# Patient Record
Sex: Female | Born: 1960 | Race: Black or African American | Hispanic: No | Marital: Single | Smoking: Never smoker
Health system: Southern US, Community
[De-identification: ages and names within clinical notes are randomized; demographics above are authoritative.]

## PROBLEM LIST (undated history)

## (undated) DIAGNOSIS — I10 Essential (primary) hypertension: Secondary | ICD-10-CM

## (undated) DIAGNOSIS — I639 Cerebral infarction, unspecified: Secondary | ICD-10-CM

## (undated) DIAGNOSIS — G43909 Migraine, unspecified, not intractable, without status migrainosus: Secondary | ICD-10-CM

## (undated) DIAGNOSIS — E669 Obesity, unspecified: Secondary | ICD-10-CM

## (undated) DIAGNOSIS — E785 Hyperlipidemia, unspecified: Secondary | ICD-10-CM

## (undated) HISTORY — PX: PILONIDAL CYST EXCISION: SHX744

## (undated) HISTORY — PX: CYST EXCISION: SHX5701

---

## 2011-02-05 ENCOUNTER — Inpatient Hospital Stay (HOSPITAL_COMMUNITY)
Admission: AD | Admit: 2011-02-05 | Discharge: 2011-02-05 | Disposition: A | Discharge status: Home / Self Care | Payer: Self-pay | Source: Ambulatory Visit | Attending: Obstetrics and Gynecology | Admitting: Obstetrics and Gynecology

## 2011-02-05 ENCOUNTER — Inpatient Hospital Stay (HOSPITAL_COMMUNITY): Payer: Self-pay

## 2011-02-05 DIAGNOSIS — N949 Unspecified condition associated with female genital organs and menstrual cycle: Secondary | ICD-10-CM | POA: Insufficient documentation

## 2011-02-05 DIAGNOSIS — N938 Other specified abnormal uterine and vaginal bleeding: Secondary | ICD-10-CM | POA: Insufficient documentation

## 2011-02-05 DIAGNOSIS — D259 Leiomyoma of uterus, unspecified: Secondary | ICD-10-CM | POA: Insufficient documentation

## 2011-02-05 DIAGNOSIS — I10 Essential (primary) hypertension: Secondary | ICD-10-CM | POA: Insufficient documentation

## 2011-02-05 LAB — WET PREP, GENITAL: Trich, Wet Prep: NONE SEEN

## 2011-02-05 LAB — CBC
MCH: 24.2 pg — ABNORMAL LOW (ref 26.0–34.0)
MCHC: 32 g/dL (ref 30.0–36.0)
Platelets: 287 10*3/uL (ref 150–400)
RBC: 4.54 MIL/uL (ref 3.87–5.11)

## 2011-02-05 LAB — SAMPLE TO BLOOD BANK

## 2011-02-06 LAB — GC/CHLAMYDIA PROBE AMP, GENITAL: Chlamydia, DNA Probe: NEGATIVE

## 2011-02-16 ENCOUNTER — Other Ambulatory Visit: Payer: Self-pay | Admitting: Obstetrics & Gynecology

## 2011-02-16 ENCOUNTER — Encounter: Payer: PRIVATE HEALTH INSURANCE | Admitting: Obstetrics & Gynecology

## 2011-02-16 DIAGNOSIS — Z1231 Encounter for screening mammogram for malignant neoplasm of breast: Secondary | ICD-10-CM

## 2011-02-16 DIAGNOSIS — D259 Leiomyoma of uterus, unspecified: Secondary | ICD-10-CM

## 2011-02-16 LAB — POCT PREGNANCY, URINE: Preg Test, Ur: NEGATIVE

## 2011-02-20 ENCOUNTER — Ambulatory Visit (HOSPITAL_COMMUNITY): Payer: PRIVATE HEALTH INSURANCE | Attending: Obstetrics & Gynecology

## 2011-02-23 ENCOUNTER — Ambulatory Visit: Payer: PRIVATE HEALTH INSURANCE

## 2011-03-03 NOTE — Progress Notes (Signed)
NAME:  Brenda Ford, Brenda Ford               ACCOUNT NO.:  [id number]  MEDICAL RECORD NO.:  [id number]           PATIENT TYPE:  A  LOCATION:  [facility]                   FACILITY:  [facility]  PHYSICIAN:  Scheryl Darter, MD       DATE OF BIRTH:  [birth year]  DATE OF SERVICE:                                 CLINIC NOTE  The patient comes today due to history of heavy bleeding, fibroids, and pain.  The patient is a 50 year old black female, gravida 5, para 2-0-3- 2, last menstrual period February 04, 2011.  The patient is having increasing problems with pain and heavy bleeding with her menses and her periods are somewhat irregular.  She came to the MAU at [facility] on February 26 due to heavy bleeding and pain.  Prior to that, her period was in July and then she had bleeding on and off after about 3 months.  Previous care was in [place] and she moved to [place] in August 2011.  She was aware that she had uterine fibroids, but her symptoms have been getting worse.  PAST MEDICAL HISTORY: 1. Hypertension. 2. Hyperlipidemia. 3. History of cerebellar stroke. 4. Anxiety and depression.  PAST SURGICAL HISTORY: 1. Lump removal from her breasts. 2. Laparoscopy. 3. Pilonidal cyst removal.  SOCIAL HISTORY:  The patient is a nonsmoker.  She denies alcohol or drug use.  ALLERGIES:  No known drug allergies.  MEDICATIONS: 1. Hydrocodone 5/325 one to two p.o. p.r.n. pain. 2. Provera one p.o. daily. 3. Ibuprofen 600 mg t.i.d.  REVIEW OF SYSTEMS:  The patient still has some pressure and fullness in her lower abdomen.  She also complains of bilateral breast pain.  She says she has gained about 30 pounds over the last year.  PHYSICAL EXAMINATION:  GENERAL:  The patient is in no acute distress. Her affect is normal. VITAL SIGNS:  Weight 200.8 pounds, height 61 inches, blood pressure 169/95, pulse 65, temperature 98.7. CHEST:  Clear. HEART:  Regular rhythm. BREASTS:  Large and mildly tender  bilaterally but no discrete masses or skin changes or axillary lymphadenopathy. ABDOMEN:  Mildly obese, soft, and slightly tender in lower quadrants with firm mass palpable above the symphysis pubis consistent with enlarged uterus. EXTREMITIES:  No swelling or deformity. PELVIC:  External genitalia, vagina, and cervix appeared normal.  Pap was done.  Uterus is about 14 to 15 weeks' size and mildly tender.  No adnexal masses.  The patient had ultrasound on February 26 that showed uterine fibroids, largest measuring 8.9 cm with a partial submucosal component.  No adnexal masses were identified.  IMPRESSION: 1. Symptomatic fibroid uterus with menorrhagia and pain. 2. History of hypertension, hypercholesterolemia, which she is     currently untreated. 3. History of stroke.  PLAN:  Make referral to Internal Medicine for management of her medical problems.  She is a candidate for total abdominal hysterectomy due to her symptomatic fibroids.  I will order Lupron Depo 11.25 mg IM, which she can receive in the next week or two.  We will also have a screening mammogram.  I deferred doing an endometrial biopsy today because of the patient's  discomfort.     Scheryl Darter, MD    JA/MEDQ  D:  02/16/2011  T:  02/17/2011  Job:  [id number]

## 2011-03-09 ENCOUNTER — Emergency Department (HOSPITAL_COMMUNITY): Payer: Self-pay

## 2011-03-09 ENCOUNTER — Emergency Department (HOSPITAL_COMMUNITY)
Admission: EM | Admit: 2011-03-09 | Discharge: 2011-03-09 | Disposition: A | Discharge status: Home / Self Care | Payer: Self-pay | Attending: Emergency Medicine | Admitting: Emergency Medicine

## 2011-03-09 DIAGNOSIS — I10 Essential (primary) hypertension: Secondary | ICD-10-CM | POA: Insufficient documentation

## 2011-03-09 DIAGNOSIS — F341 Dysthymic disorder: Secondary | ICD-10-CM | POA: Insufficient documentation

## 2011-03-09 DIAGNOSIS — Z8673 Personal history of transient ischemic attack (TIA), and cerebral infarction without residual deficits: Secondary | ICD-10-CM | POA: Insufficient documentation

## 2011-03-09 DIAGNOSIS — E785 Hyperlipidemia, unspecified: Secondary | ICD-10-CM | POA: Insufficient documentation

## 2011-03-09 DIAGNOSIS — R51 Headache: Secondary | ICD-10-CM | POA: Insufficient documentation

## 2011-03-14 ENCOUNTER — Ambulatory Visit: Payer: PRIVATE HEALTH INSURANCE | Admitting: Family Medicine

## 2011-03-20 ENCOUNTER — Ambulatory Visit: Payer: PRIVATE HEALTH INSURANCE | Admitting: Obstetrics & Gynecology

## 2011-04-20 ENCOUNTER — Ambulatory Visit: Payer: PRIVATE HEALTH INSURANCE | Admitting: Obstetrics & Gynecology

## 2011-09-28 ENCOUNTER — Inpatient Hospital Stay (INDEPENDENT_AMBULATORY_CARE_PROVIDER_SITE_OTHER)
Admission: RE | Admit: 2011-09-28 | Discharge: 2011-09-28 | Disposition: A | Discharge status: Home / Self Care | Payer: PRIVATE HEALTH INSURANCE | Source: Ambulatory Visit | Attending: Family Medicine | Admitting: Family Medicine

## 2011-09-28 DIAGNOSIS — I10 Essential (primary) hypertension: Secondary | ICD-10-CM

## 2011-09-28 DIAGNOSIS — R599 Enlarged lymph nodes, unspecified: Secondary | ICD-10-CM

## 2011-09-28 DIAGNOSIS — K089 Disorder of teeth and supporting structures, unspecified: Secondary | ICD-10-CM

## 2011-09-28 DIAGNOSIS — M542 Cervicalgia: Secondary | ICD-10-CM

## 2012-02-11 ENCOUNTER — Encounter (HOSPITAL_COMMUNITY): Payer: Self-pay | Admitting: *Deleted

## 2012-02-11 ENCOUNTER — Emergency Department (HOSPITAL_COMMUNITY): Payer: Medicare Other

## 2012-02-11 ENCOUNTER — Other Ambulatory Visit: Payer: Self-pay

## 2012-02-11 ENCOUNTER — Emergency Department (HOSPITAL_COMMUNITY)
Admission: EM | Admit: 2012-02-11 | Discharge: 2012-02-11 | Disposition: A | Discharge status: Home Health | Payer: Medicare Other | Attending: Emergency Medicine | Admitting: Emergency Medicine

## 2012-02-11 DIAGNOSIS — M2550 Pain in unspecified joint: Secondary | ICD-10-CM | POA: Insufficient documentation

## 2012-02-11 DIAGNOSIS — Z8673 Personal history of transient ischemic attack (TIA), and cerebral infarction without residual deficits: Secondary | ICD-10-CM | POA: Insufficient documentation

## 2012-02-11 DIAGNOSIS — I10 Essential (primary) hypertension: Secondary | ICD-10-CM | POA: Insufficient documentation

## 2012-02-11 DIAGNOSIS — F411 Generalized anxiety disorder: Secondary | ICD-10-CM | POA: Insufficient documentation

## 2012-02-11 DIAGNOSIS — R5381 Other malaise: Secondary | ICD-10-CM | POA: Insufficient documentation

## 2012-02-11 DIAGNOSIS — R51 Headache: Secondary | ICD-10-CM | POA: Insufficient documentation

## 2012-02-11 DIAGNOSIS — H9319 Tinnitus, unspecified ear: Secondary | ICD-10-CM | POA: Insufficient documentation

## 2012-02-11 DIAGNOSIS — E119 Type 2 diabetes mellitus without complications: Secondary | ICD-10-CM | POA: Insufficient documentation

## 2012-02-11 DIAGNOSIS — R5383 Other fatigue: Secondary | ICD-10-CM | POA: Insufficient documentation

## 2012-02-11 HISTORY — DX: Cerebral infarction, unspecified: I63.9

## 2012-02-11 LAB — POCT PREGNANCY, URINE: Preg Test, Ur: NEGATIVE

## 2012-02-11 LAB — CBC
MCH: 24.7 pg — ABNORMAL LOW (ref 26.0–34.0)
MCV: 73.9 fL — ABNORMAL LOW (ref 78.0–100.0)
Platelets: 329 10*3/uL (ref 150–400)
RDW: 14.8 % (ref 11.5–15.5)

## 2012-02-11 LAB — URINE MICROSCOPIC-ADD ON

## 2012-02-11 LAB — COMPREHENSIVE METABOLIC PANEL
AST: 37 U/L (ref 0–37)
Albumin: 3.8 g/dL (ref 3.5–5.2)
CO2: 28 mEq/L (ref 19–32)
Calcium: 10.9 mg/dL — ABNORMAL HIGH (ref 8.4–10.5)
Creatinine, Ser: 0.77 mg/dL (ref 0.50–1.10)
GFR calc non Af Amer: >90 mL/min (ref >90)

## 2012-02-11 LAB — URINALYSIS, ROUTINE W REFLEX MICROSCOPIC
Nitrite: NEGATIVE
Specific Gravity, Urine: 1.039 — ABNORMAL HIGH (ref 1.005–1.030)
Urobilinogen, UA: 0.2 mg/dL (ref 0.0–1.0)

## 2012-02-11 MED ORDER — METFORMIN HCL 500 MG PO TABS
500.0000 mg | ORAL_TABLET | ORAL | Status: AC
  Administered 2012-02-11: 500 mg via ORAL
  Filled 2012-02-11: qty 1

## 2012-02-11 MED ORDER — METFORMIN HCL 500 MG PO TABS: 500.0000 mg | ORAL_TABLET | Freq: Two times a day (BID) | ORAL | Status: DC

## 2012-02-11 MED ORDER — HYDROCHLOROTHIAZIDE 25 MG PO TABS: 25.0000 mg | ORAL_TABLET | Freq: Every day | ORAL | Status: DC

## 2012-02-11 MED ORDER — HYDROCHLOROTHIAZIDE 25 MG PO TABS
25.0000 mg | ORAL_TABLET | ORAL | Status: AC
  Administered 2012-02-11: 25 mg via ORAL
  Filled 2012-02-11: qty 1

## 2012-02-11 NOTE — ED Notes (Signed)
The pt has someswelling in her anterior shoulders bi-laterally ears ringing pain inher eyes headache abd and chest pains fluid i in her rt elbow.   All these for 3-4 weesk

## 2012-02-11 NOTE — ED Notes (Signed)
Pt has not taken any of her previous meds for a year-since she moved from [place] to [place].

## 2012-02-11 NOTE — ED Provider Notes (Signed)
History     CSN: [id number]  Arrival date & time 02/11/12  1403   First MD Initiated Contact with Patient 02/11/12 1430      Chief Complaint  Patient presents with  . multiple complaints     HPI Patient presents to the emergency room with complaints of several issues. Patient tells me that her primary is concern is that she felt that her lymph glands were swollen up in her neck. Patient has a sister who passed away from lymphoma and this understandably caused her some concern. Patient states she's also had occasional headaches and ringing in her ears. She has felt some general weakness and malaise. She did not complain of any specific chest pain or shortness of breath with me. She also noted some aching in her joints and felt like she had fluid in her elbow on the right side previously. Patient does not have a primary doctor. She does have history of hypertension and a prior stroke but she has not been taking any medications. Takes and states she just stopped taking them on her own. Symptoms are moderate. Nothing seems to make them better or worse. Past Medical History  Diagnosis Date  . Stroke     History reviewed. No pertinent past surgical history.  History reviewed. No pertinent family history.  History  Substance Use Topics  . Smoking status: Never Smoker   . Smokeless tobacco: Not on file  . Alcohol Use: No    OB History    Grav Para Term Preterm Abortions TAB SAB Ect Mult Living                  Review of Systems  Constitutional: Negative for fever and unexpected weight change.  Respiratory: Negative for shortness of breath.   Gastrointestinal: Negative for abdominal distention.  Neurological: Negative for tremors, seizures and syncope.  Hematological: Does not bruise/bleed easily.  Psychiatric/Behavioral: Negative for confusion.  All other systems reviewed and are negative.    Allergies  Review of patient's allergies indicates no known allergies.  Home  Medications   Current Outpatient Rx  Name Route Sig Dispense Refill  . IBUPROFEN 200 MG PO TABS Oral Take 400 mg by mouth every 6 (six) hours as needed. For pain    . VISINE OP Ophthalmic Apply 1 drop to eye daily as needed. For burning eyes      BP 177/108  Pulse 74  Temp(Src) 98.5 F (36.9 C) (Oral)  Resp 16  SpO2 96%  LMP 02/11/2011  Physical Exam  Nursing note and vitals reviewed. Constitutional: She appears well-developed and well-nourished. No distress.  HENT:  Head: Normocephalic and atraumatic.  Right Ear: External ear normal.  Left Ear: External ear normal.  Eyes: Conjunctivae are normal. Right eye exhibits no discharge. Left eye exhibits no discharge. No scleral icterus.  Neck: Neck supple. No tracheal deviation present.  Cardiovascular: Normal rate, regular rhythm and intact distal pulses.   Pulmonary/Chest: Effort normal and breath sounds normal. No stridor. No respiratory distress. She has no wheezes. She has no rales.  Abdominal: Soft. Bowel sounds are normal. She exhibits no distension. There is no tenderness. There is no rebound and no guarding.  Musculoskeletal: She exhibits no edema and no tenderness.  Neurological: She is alert. She has normal strength. No sensory deficit. Cranial nerve deficit:  no gross defecits noted. She exhibits normal muscle tone. She displays no seizure activity. Coordination normal.  Skin: Skin is warm and dry. No rash noted.  Psychiatric:  Her mood appears anxious.       Tearful when talking about her sister    ED Course  Procedures (including critical care time)  Date: 02/11/2012  Rate: 55  Rhythm: sinus bradycardia  QRS Axis: normal  Intervals: normal  ST/T Wave abnormalities: normal  Conduction Disutrbances:first-degree A-V block   Narrative Interpretation:   Old EKG Reviewed: unchanged   Labs Reviewed  URINALYSIS, ROUTINE W REFLEX MICROSCOPIC - Abnormal; Notable for the following:    Specific Gravity, Urine 1.039 (*)      Glucose, UA >1000 (*)    All other components within normal limits  URINE MICROSCOPIC-ADD ON - Abnormal; Notable for the following:    Squamous Epithelial / LPF FEW (*)    Bacteria, UA FEW (*)    All other components within normal limits  CBC - Abnormal; Notable for the following:    MCV 73.9 (*)    MCH 24.7 (*)    All other components within normal limits  COMPREHENSIVE METABOLIC PANEL - Abnormal; Notable for the following:    Glucose, Bld 297 (*)    Calcium 10.9 (*)    Total Protein 8.6 (*)    ALT 42 (*)    Alkaline Phosphatase 125 (*)    All other components within normal limits  POCT PREGNANCY, URINE   Dg Chest 2 View  02/11/2012  *RADIOLOGY REPORT*  Clinical Data: Weakness and pain  CHEST - 2 VIEW  Comparison: None.  Findings: Normal mediastinum and cardiac silhouette.  Normal pulmonary  vasculature.  No evidence of effusion, infiltrate, or pneumothorax.  No acute bony abnormality.  IMPRESSION: No acute cardiopulmonary process.  Original Report Authenticated By: Genevive Bi, M.D.     MDM  The patient appears to have untreated hypertension. Repeat blood pressure was improved without intervention but it is still elevated at 156/92. The patient is supposed to be on blood pressure medications and has not followed up with her primary doctor for over a year. I will prescribe her hydrochlorothiazide. Her laboratory tests also suggest that she has diabetes.  This is a new diagnosis for the patient. At this time there is no signs of acidosis to suggest diabetic ketoacidosis. She is alert and there is no evidence to suggest hyperosmolar nonketotic syndrome. I discussed with the patient diet and exercise. I will start her on metformin.  I explained the importance of her following up with the primary doctor to discuss blood sugar monitoring, and further treatment of her diabetes.        Celene Kras, MD 02/11/12 [phone]

## 2012-04-19 IMAGING — US US PELVIS COMPLETE
1 series · 13 of 25 positions shown · non-contrast
Comparison: None.

CLINICAL DATA: Heavy vaginal bleeding.  Pelvic pain.  Fibroids.



[Series 1: us transvaginal non-ob · 13 of 33 slices shown]
[im 1/33]
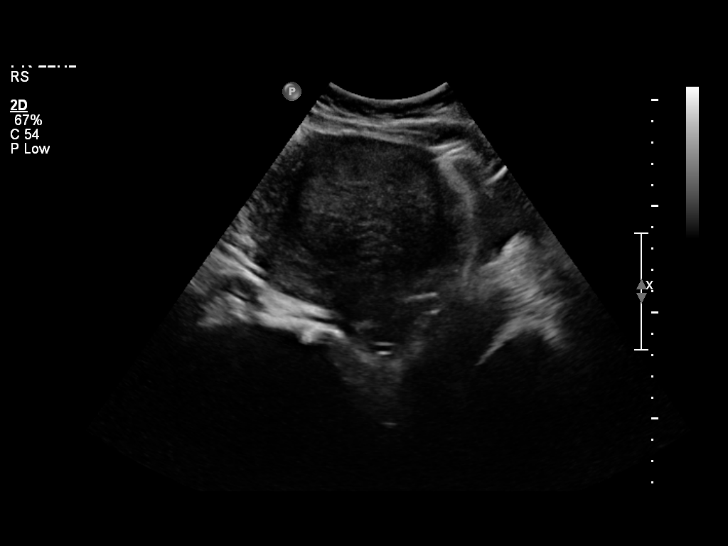
[im 3/33]
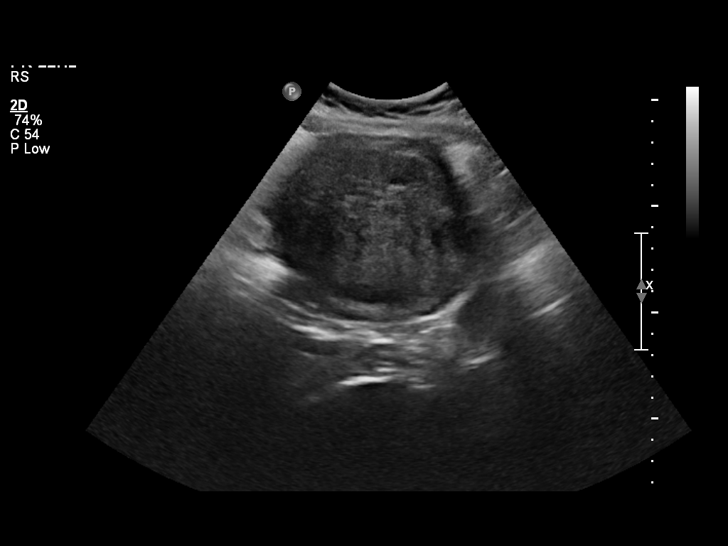
[im 6/33]
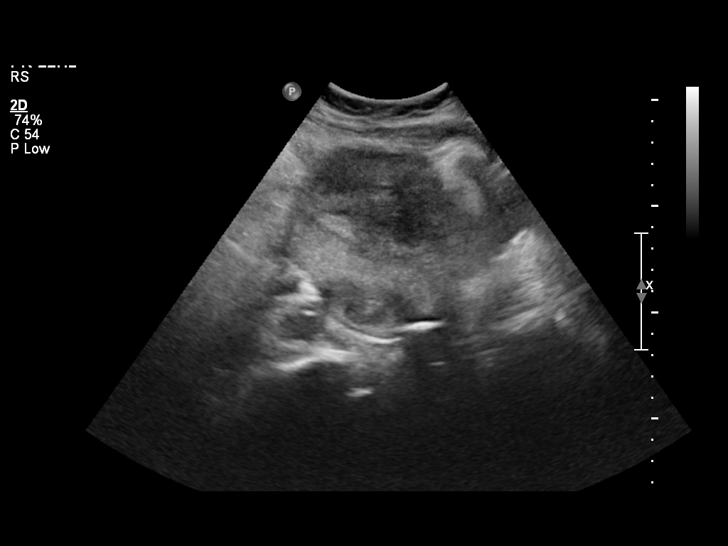
[im 9/33]
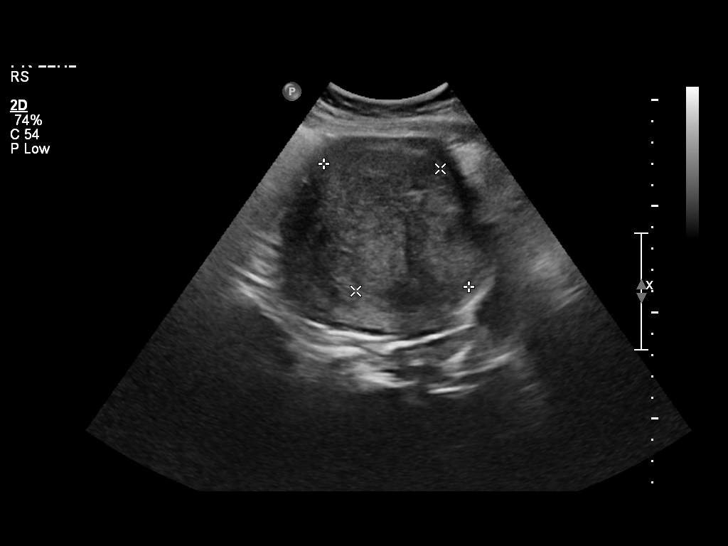
[im 11/33]
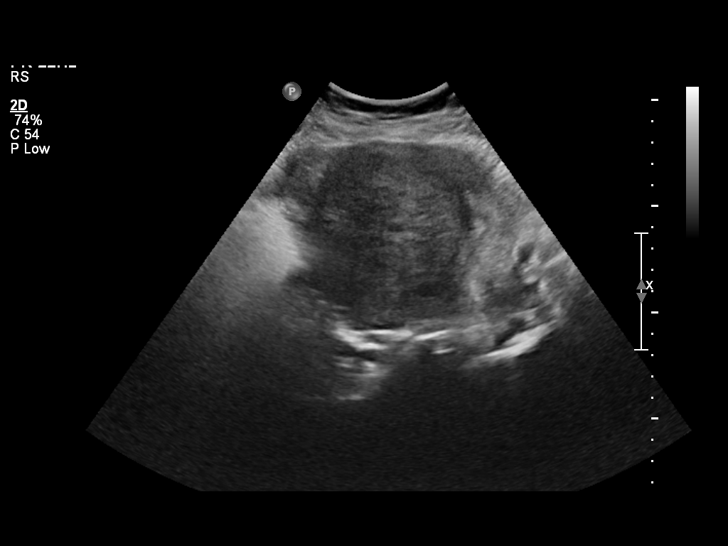
[im 14/33]
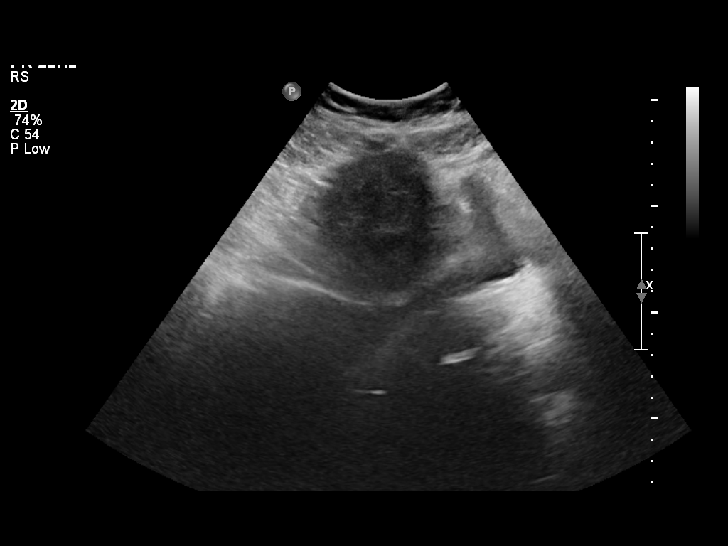
[im 17/33]
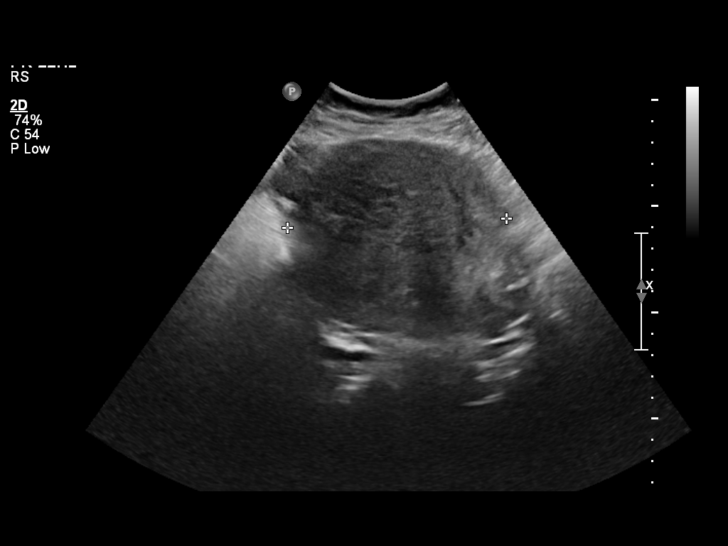
[im 19/33]
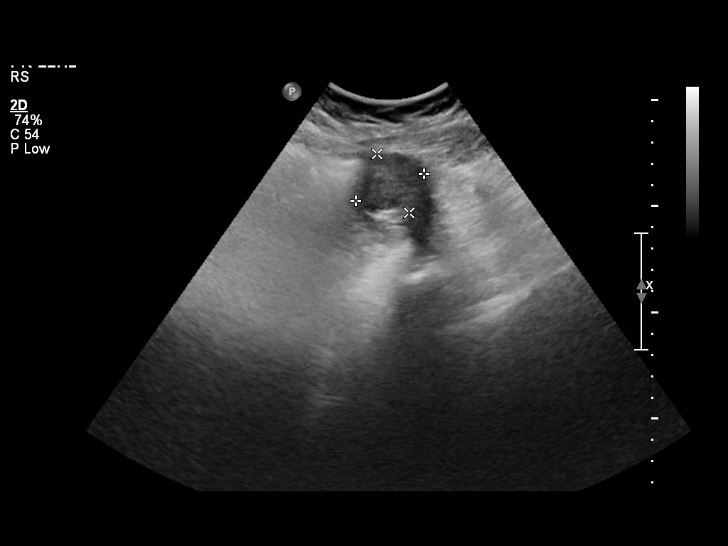
[im 22/33]
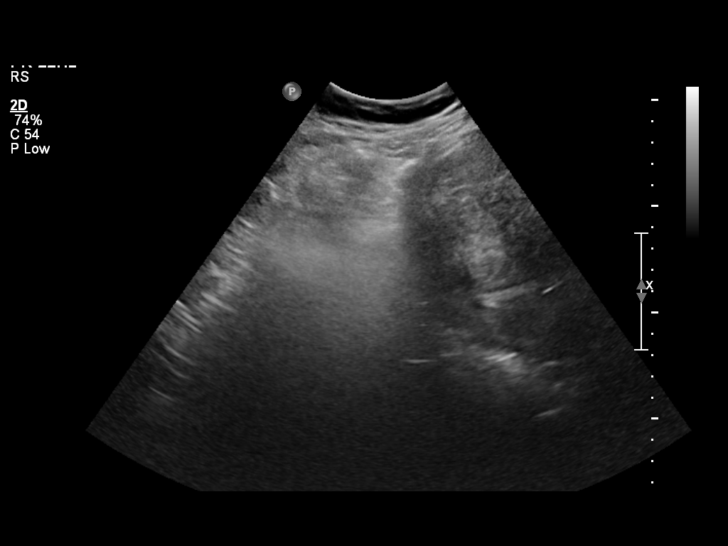
[im 25/33]
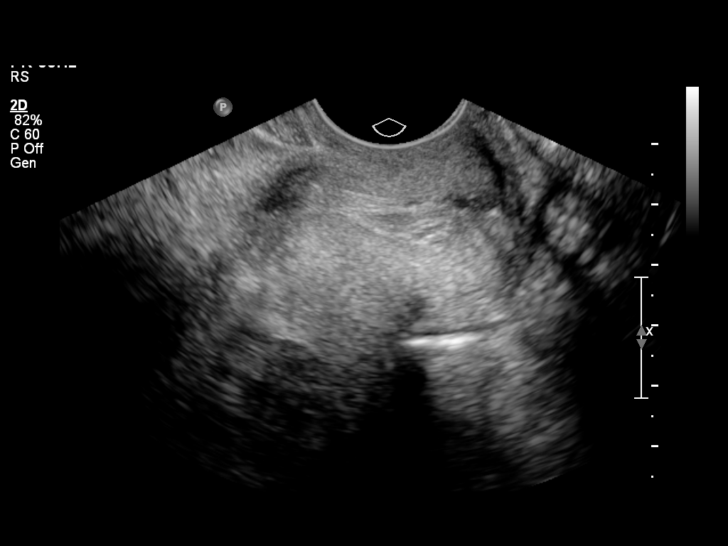
[im 27/33]
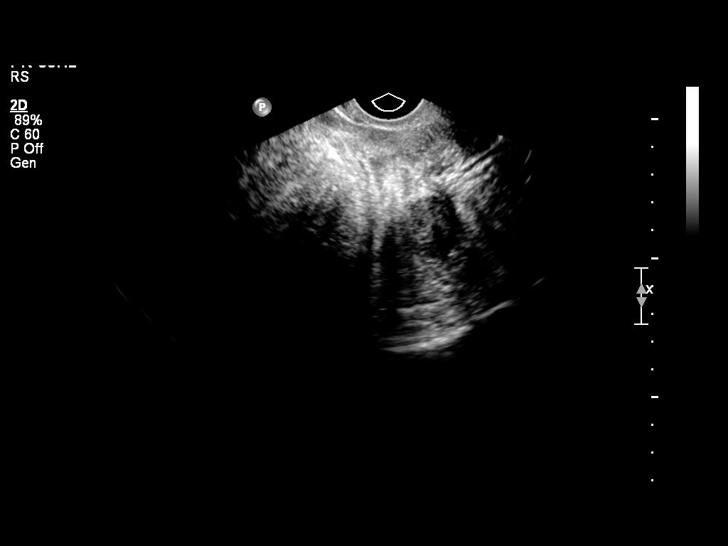
[im 30/33]
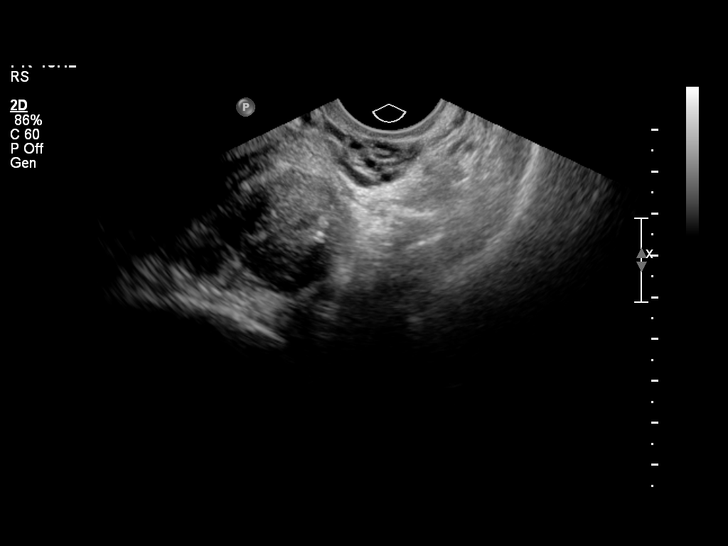
[im 33/33]
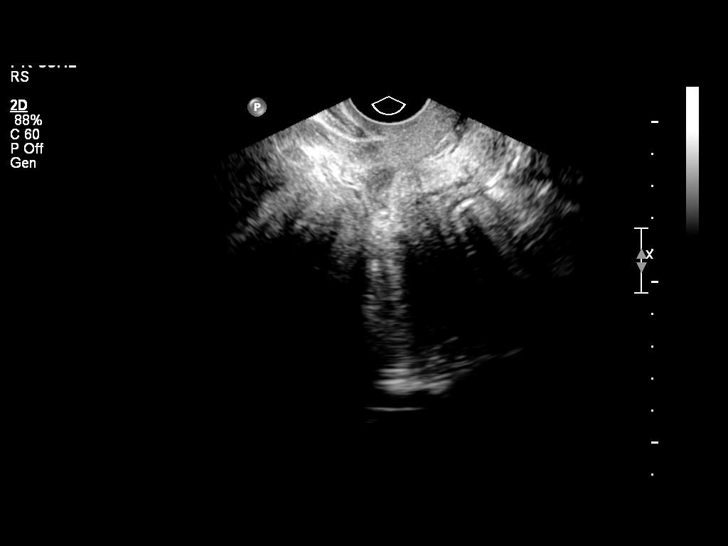

[13 of 25 positions shown; findings below may reference images not displayed]

FINDINGS: Uterus measures 12.7 x 9.8 x 10.3 cm. Several fibroids are seen,
which range in size from 3.4 cm to the largest in the right uterine
body measuring 8.9 cm.  This largest fibroid has a submucosal
component displacing the endometrial to the left.

Endometrium measures 8 mm in thickness.  Within normal limits in
appearance.

Right Ovary is not directly visualized by transabdominal or
transvaginal sonography, however no adnexal mass identified.

Left Ovary is not directly visualized by transabdominal or
transvaginal sonography, however no adnexal mass identified.

Other Findings:  No free fluid or other abnormality identified.
IMPRESSION: 1.  Several uterine fibroids, with largest measuring 8.9 cm and
having a partial submucosal component.
2.  Nonvisualization of the ovaries, however no adnexal mass or
free fluid identified.

## 2012-07-31 ENCOUNTER — Encounter (HOSPITAL_COMMUNITY): Payer: Self-pay | Admitting: Emergency Medicine

## 2012-07-31 ENCOUNTER — Emergency Department (HOSPITAL_COMMUNITY)
Admission: EM | Admit: 2012-07-31 | Discharge: 2012-08-01 | Disposition: A | Discharge status: Home / Self Care | Payer: Medicare (Managed Care) | Attending: Emergency Medicine | Admitting: Emergency Medicine

## 2012-07-31 DIAGNOSIS — M25519 Pain in unspecified shoulder: Secondary | ICD-10-CM | POA: Insufficient documentation

## 2012-07-31 DIAGNOSIS — Z8673 Personal history of transient ischemic attack (TIA), and cerebral infarction without residual deficits: Secondary | ICD-10-CM | POA: Insufficient documentation

## 2012-07-31 DIAGNOSIS — E119 Type 2 diabetes mellitus without complications: Secondary | ICD-10-CM | POA: Insufficient documentation

## 2012-07-31 DIAGNOSIS — I10 Essential (primary) hypertension: Secondary | ICD-10-CM | POA: Insufficient documentation

## 2012-07-31 DIAGNOSIS — Z79899 Other long term (current) drug therapy: Secondary | ICD-10-CM | POA: Insufficient documentation

## 2012-07-31 DIAGNOSIS — R0602 Shortness of breath: Secondary | ICD-10-CM | POA: Insufficient documentation

## 2012-07-31 DIAGNOSIS — R091 Pleurisy: Secondary | ICD-10-CM | POA: Insufficient documentation

## 2012-07-31 DIAGNOSIS — R079 Chest pain, unspecified: Secondary | ICD-10-CM | POA: Insufficient documentation

## 2012-07-31 HISTORY — DX: Essential (primary) hypertension: I10

## 2012-07-31 NOTE — ED Notes (Signed)
Chest pain under left breast x 3 days; tender to touch; SOB. Worsening so called EMS; EKG unremarkable; Asprin and nitro given by EMS; unresolved pain.

## 2012-08-01 ENCOUNTER — Encounter (HOSPITAL_COMMUNITY): Payer: Self-pay | Admitting: Radiology

## 2012-08-01 ENCOUNTER — Emergency Department (HOSPITAL_COMMUNITY): Payer: Medicare (Managed Care)

## 2012-08-01 LAB — URINALYSIS, ROUTINE W REFLEX MICROSCOPIC
Ketones, ur: NEGATIVE mg/dL
Leukocytes, UA: NEGATIVE
Nitrite: NEGATIVE
Protein, ur: NEGATIVE mg/dL

## 2012-08-01 LAB — CBC WITH DIFFERENTIAL/PLATELET
HCT: 35 % — ABNORMAL LOW (ref 36.0–46.0)
Hemoglobin: 11.6 g/dL — ABNORMAL LOW (ref 12.0–15.0)
Lymphocytes Relative: 34 % (ref 12–46)
MCHC: 33.1 g/dL (ref 30.0–36.0)
MCV: 75.6 fL — ABNORMAL LOW (ref 78.0–100.0)
Monocytes Absolute: 0.5 10*3/uL (ref 0.1–1.0)
Monocytes Relative: 7 % (ref 3–12)
Neutro Abs: 4.2 10*3/uL (ref 1.7–7.7)
WBC: 7.2 10*3/uL (ref 4.0–10.5)

## 2012-08-01 LAB — COMPREHENSIVE METABOLIC PANEL
BUN: 9 mg/dL (ref 6–23)
CO2: 31 mEq/L (ref 19–32)
Chloride: 100 mEq/L (ref 96–112)
Creatinine, Ser: 0.78 mg/dL (ref 0.50–1.10)
GFR calc Af Amer: >90 mL/min (ref >90)
GFR calc non Af Amer: >90 mL/min (ref >90)
Total Bilirubin: 0.5 mg/dL (ref 0.3–1.2)

## 2012-08-01 LAB — D-DIMER, QUANTITATIVE: D-Dimer, Quant: 0.75 ug/mL-FEU — ABNORMAL HIGH (ref 0.00–0.48)

## 2012-08-01 MED ORDER — POTASSIUM CHLORIDE ER 10 MEQ PO TBCR: 10.0000 meq | EXTENDED_RELEASE_TABLET | Freq: Two times a day (BID) | ORAL | Status: DC

## 2012-08-01 MED ORDER — ONDANSETRON HCL 4 MG/2ML IJ SOLN
4.0000 mg | Freq: Once | INTRAMUSCULAR | Status: AC
  Administered 2012-08-01: 4 mg via INTRAVENOUS
  Filled 2012-08-01: qty 2

## 2012-08-01 MED ORDER — HYDROMORPHONE HCL PF 1 MG/ML IJ SOLN
1.0000 mg | Freq: Once | INTRAMUSCULAR | Status: AC
  Administered 2012-08-01: 1 mg via INTRAVENOUS
  Filled 2012-08-01: qty 1

## 2012-08-01 MED ORDER — POTASSIUM CHLORIDE CRYS ER 20 MEQ PO TBCR
40.0000 meq | EXTENDED_RELEASE_TABLET | Freq: Once | ORAL | Status: AC
  Administered 2012-08-01: 40 meq via ORAL
  Filled 2012-08-01: qty 4

## 2012-08-01 MED ORDER — IOHEXOL 350 MG/ML SOLN
100.0000 mL | Freq: Once | INTRAVENOUS | Status: AC | PRN
  Administered 2012-08-01: 100 mL via INTRAVENOUS

## 2012-08-01 MED ORDER — HYDROCODONE-ACETAMINOPHEN 5-325 MG PO TABS: 1.0000 | ORAL_TABLET | Freq: Four times a day (QID) | ORAL | Status: AC | PRN

## 2012-08-01 NOTE — ED Notes (Signed)
PT ambulated with baseline gait; VSS; A&Ox3; no signs of distress; respirations even and unlabored; skin warm and dry; no questions upon discharge.  

## 2012-08-01 NOTE — ED Provider Notes (Addendum)
History     CSN: [id number]  Arrival date & time 07/31/12  2336   First MD Initiated Contact with Patient 08/01/12 0010      Chief Complaint  Patient presents with  . Chest Pain    (Consider location/radiation/quality/duration/timing/severity/associated sxs/prior treatment) Patient is a 51 y.o. female presenting with chest pain. The history is provided by the patient (chest pain). A language interpreter was used.  Chest Pain The chest pain began yesterday. Chest pain occurs constantly. The chest pain is unchanged. The pain is associated with breathing. At its most intense, the pain is at 6/10. The pain is currently at 5/10. The severity of the pain is moderate. The quality of the pain is described as aching. The pain does not radiate. Chest pain is worsened by certain positions. Pertinent negatives for primary symptoms include no fever, no fatigue, no cough and no abdominal pain.  Pertinent negatives for associated symptoms include no claudication.  Pertinent negatives for past medical history include no seizures.     Past Medical History  Diagnosis Date  . Stroke   . Diabetes mellitus   . Hypertension     No past surgical history on file.  No family history on file.  History  Substance Use Topics  . Smoking status: Never Smoker   . Smokeless tobacco: Not on file  . Alcohol Use: No    OB History    Grav Para Term Preterm Abortions TAB SAB Ect Mult Living                  Review of Systems  Constitutional: Negative for fever and fatigue.  HENT: Negative for congestion, sinus pressure and ear discharge.   Eyes: Negative for discharge.  Respiratory: Negative for cough.   Cardiovascular: Positive for chest pain. Negative for claudication.  Gastrointestinal: Negative for abdominal pain and diarrhea.  Genitourinary: Negative for frequency and hematuria.  Musculoskeletal: Negative for back pain.  Skin: Negative for rash.  Neurological: Negative for seizures and  headaches.  Hematological: Negative.   Psychiatric/Behavioral: Negative for hallucinations.    Allergies  Review of patient's allergies indicates no known allergies.  Home Medications   Current Outpatient Rx  Name Route Sig Dispense Refill  . CALCIUM CARBONATE ANTACID 500 MG PO CHEW Oral Chew 1 tablet by mouth once.    Marland Kitchen HYDROCHLOROTHIAZIDE 25 MG PO TABS Oral Take 1 tablet (25 mg total) by mouth daily. 30 tablet 1  . METFORMIN HCL 500 MG PO TABS Oral Take 1 tablet (500 mg total) by mouth 2 (two) times daily with a meal. 60 tablet 1  . HYDROCODONE-ACETAMINOPHEN 5-325 MG PO TABS Oral Take 1 tablet by mouth every 6 (six) hours as needed for pain. 20 tablet 0  . POTASSIUM CHLORIDE ER 10 MEQ PO TBCR Oral Take 1 tablet (10 mEq total) by mouth 2 (two) times daily. 10 tablet 0    BP 165/105  Pulse 58  Temp 97.9 F (36.6 C) (Oral)  Resp 18  SpO2 100%  Physical Exam  Constitutional: She is oriented to person, place, and time. She appears well-developed.  HENT:  Head: Normocephalic and atraumatic.  Eyes: Conjunctivae and EOM are normal. No scleral icterus.  Neck: Neck supple. No thyromegaly present.  Cardiovascular: Normal rate and regular rhythm.  Exam reveals no gallop and no friction rub.   No murmur heard. Pulmonary/Chest: No stridor. She has no wheezes. She has no rales. She exhibits no tenderness.  Abdominal: She exhibits no distension. There is  no tenderness. There is no rebound.  Musculoskeletal: Normal range of motion. She exhibits no edema.  Lymphadenopathy:    She has no cervical adenopathy.  Neurological: She is oriented to person, place, and time. Coordination normal.  Skin: No rash noted. No erythema.  Psychiatric: She has a normal mood and affect. Her behavior is normal.    ED Course  Procedures (including critical care time)  Labs Reviewed  CBC WITH DIFFERENTIAL - Abnormal; Notable for the following:    Hemoglobin 11.6 (*)     HCT 35.0 (*)     MCV 75.6 (*)      MCH 25.1 (*)     All other components within normal limits  COMPREHENSIVE METABOLIC PANEL - Abnormal; Notable for the following:    Potassium 2.9 (*)     Glucose, Bld 153 (*)     All other components within normal limits  D-DIMER, QUANTITATIVE - Abnormal; Notable for the following:    D-Dimer, Quant 0.75 (*)     All other components within normal limits  URINALYSIS, ROUTINE W REFLEX MICROSCOPIC - Abnormal; Notable for the following:    pH 8.5 (*)     All other components within normal limits   Ct Angio Chest Pe W/cm &/or Wo Cm  08/01/2012  *RADIOLOGY REPORT*  Clinical Data: Chest pain under the left breast.  Shortness of breath.  D-dimer 0.75.  CT ANGIOGRAPHY CHEST  Technique:  Multidetector CT imaging of the chest using the standard protocol during bolus administration of intravenous contrast. Multiplanar reconstructed images including MIPs were obtained and reviewed to evaluate the vascular anatomy.  Contrast: OMNIPAQUE IOHEXOL 350 MG/ML SOLN  Comparison: None.  Findings: Technically adequate study with good opacification of the central and segmental pulmonary arteries.  No focal filling defects demonstrated.  No evidence of significant pulmonary embolus.  Normal heart size.  Normal caliber thoracic aorta.  No significant lymphadenopathy in the chest.  Esophagus is decompressed.  No pleural effusions.  Dependent atelectatic changes in the lungs.  No focal airspace consolidation or edema.  No pneumothorax.  No significant interstitial changes.  Airways appear patent.  The normal alignment of thoracic vertebra.  IMPRESSION: No evidence of significant pulmonary embolus.   Original Report Authenticated By: Marlon Pel, M.D.    Dg Chest Port 1 View  08/01/2012  *RADIOLOGY REPORT*  Clinical Data: Left shoulder and left-sided pain.  Shortness of breath.  No injury.  PORTABLE CHEST - 1 VIEW  Comparison: 02/11/2012  Findings: Shallow inspiration.  Normal heart size and pulmonary vascularity.   No focal airspace consolidation or edema in the lungs.  No blunting of costophrenic angles.  No pneumothorax. Mediastinal contours appear intact.  Calcification of the aorta. No significant change since previous study.  IMPRESSION: Shallow inspiration.  No evidence of active pulmonary disease.   Original Report Authenticated By: Marlon Pel, M.D.      1. Pleuritis       MDM     Date: 08/29/2012  Rate: 80  Rhythm: normal sinus rhythm  QRS Axis: normal  Intervals: normal  ST/T Wave abnormalities: normal  Conduction Disutrbances:normal  Narrative Interpretation:   Old EKG Reviewed: none available        Benny Lennert, MD 08/01/12 [id number]  Benny Lennert, MD 08/29/12 [phone]

## 2012-08-01 NOTE — ED Notes (Signed)
Patient transported to CT 

## 2012-08-01 NOTE — ED Notes (Signed)
MD at bedside. 

## 2012-08-01 NOTE — ED Notes (Signed)
Pt returned from CT; no signs of distress; pain is reduced.

## 2012-10-25 ENCOUNTER — Emergency Department (HOSPITAL_COMMUNITY): Payer: Medicare (Managed Care)

## 2012-10-25 ENCOUNTER — Observation Stay (HOSPITAL_COMMUNITY)
Admission: EM | Admit: 2012-10-25 | Discharge: 2012-10-30 | Disposition: A | Discharge status: Home / Self Care | Payer: Medicare (Managed Care) | Attending: Internal Medicine | Admitting: Internal Medicine

## 2012-10-25 ENCOUNTER — Encounter (HOSPITAL_COMMUNITY): Payer: Self-pay | Admitting: *Deleted

## 2012-10-25 ENCOUNTER — Emergency Department (INDEPENDENT_AMBULATORY_CARE_PROVIDER_SITE_OTHER)
Admission: EM | Admit: 2012-10-25 | Discharge: 2012-10-25 | Disposition: A | Discharge status: Home / Self Care | Payer: Medicare Other | Source: Home / Self Care | Attending: Emergency Medicine | Admitting: Emergency Medicine

## 2012-10-25 ENCOUNTER — Encounter (HOSPITAL_COMMUNITY): Payer: Self-pay | Admitting: Emergency Medicine

## 2012-10-25 DIAGNOSIS — R42 Dizziness and giddiness: Principal | ICD-10-CM | POA: Diagnosis present

## 2012-10-25 DIAGNOSIS — I10 Essential (primary) hypertension: Secondary | ICD-10-CM

## 2012-10-25 DIAGNOSIS — G44309 Post-traumatic headache, unspecified, not intractable: Secondary | ICD-10-CM

## 2012-10-25 DIAGNOSIS — R011 Cardiac murmur, unspecified: Secondary | ICD-10-CM

## 2012-10-25 DIAGNOSIS — IMO0001 Reserved for inherently not codable concepts without codable children: Secondary | ICD-10-CM

## 2012-10-25 DIAGNOSIS — R51 Headache: Secondary | ICD-10-CM

## 2012-10-25 DIAGNOSIS — I6509 Occlusion and stenosis of unspecified vertebral artery: Secondary | ICD-10-CM | POA: Insufficient documentation

## 2012-10-25 DIAGNOSIS — Z79899 Other long term (current) drug therapy: Secondary | ICD-10-CM | POA: Insufficient documentation

## 2012-10-25 DIAGNOSIS — H539 Unspecified visual disturbance: Secondary | ICD-10-CM

## 2012-10-25 DIAGNOSIS — S0990XS Unspecified injury of head, sequela: Secondary | ICD-10-CM

## 2012-10-25 DIAGNOSIS — M79609 Pain in unspecified limb: Secondary | ICD-10-CM | POA: Insufficient documentation

## 2012-10-25 DIAGNOSIS — Z8673 Personal history of transient ischemic attack (TIA), and cerebral infarction without residual deficits: Secondary | ICD-10-CM | POA: Insufficient documentation

## 2012-10-25 DIAGNOSIS — E119 Type 2 diabetes mellitus without complications: Secondary | ICD-10-CM | POA: Diagnosis present

## 2012-10-25 DIAGNOSIS — Z9119 Patient's noncompliance with other medical treatment and regimen: Secondary | ICD-10-CM | POA: Insufficient documentation

## 2012-10-25 DIAGNOSIS — Z91199 Patient's noncompliance with other medical treatment and regimen due to unspecified reason: Secondary | ICD-10-CM | POA: Insufficient documentation

## 2012-10-25 DIAGNOSIS — M79672 Pain in left foot: Secondary | ICD-10-CM | POA: Diagnosis present

## 2012-10-25 DIAGNOSIS — E785 Hyperlipidemia, unspecified: Secondary | ICD-10-CM | POA: Diagnosis present

## 2012-10-25 DIAGNOSIS — E669 Obesity, unspecified: Secondary | ICD-10-CM | POA: Insufficient documentation

## 2012-10-25 DIAGNOSIS — Z23 Encounter for immunization: Secondary | ICD-10-CM | POA: Insufficient documentation

## 2012-10-25 DIAGNOSIS — H538 Other visual disturbances: Secondary | ICD-10-CM | POA: Insufficient documentation

## 2012-10-25 DIAGNOSIS — I679 Cerebrovascular disease, unspecified: Secondary | ICD-10-CM | POA: Diagnosis present

## 2012-10-25 HISTORY — DX: Hyperlipidemia, unspecified: E78.5

## 2012-10-25 HISTORY — DX: Obesity, unspecified: E66.9

## 2012-10-25 HISTORY — DX: Migraine, unspecified, not intractable, without status migrainosus: G43.909

## 2012-10-25 LAB — URINALYSIS, ROUTINE W REFLEX MICROSCOPIC
Bilirubin Urine: NEGATIVE
Nitrite: NEGATIVE
Specific Gravity, Urine: 1.024 (ref 1.005–1.030)
Urobilinogen, UA: 0.2 mg/dL (ref 0.0–1.0)

## 2012-10-25 LAB — COMPREHENSIVE METABOLIC PANEL
Albumin: 4.2 g/dL (ref 3.5–5.2)
BUN: 11 mg/dL (ref 6–23)
Calcium: 10.5 mg/dL (ref 8.4–10.5)
Creatinine, Ser: 0.89 mg/dL (ref 0.50–1.10)
Potassium: 3.8 mEq/L (ref 3.5–5.1)
Total Protein: 9 g/dL — ABNORMAL HIGH (ref 6.0–8.3)

## 2012-10-25 LAB — URINE MICROSCOPIC-ADD ON

## 2012-10-25 LAB — CBC WITH DIFFERENTIAL/PLATELET
Basophils Absolute: 0 10*3/uL (ref 0.0–0.1)
Basophils Relative: 0 % (ref 0–1)
HCT: 39.2 % (ref 36.0–46.0)
MCHC: 33.2 g/dL (ref 30.0–36.0)
Monocytes Absolute: 0.5 10*3/uL (ref 0.1–1.0)
Neutro Abs: 5.4 10*3/uL (ref 1.7–7.7)
Neutrophils Relative %: 64 % (ref 43–77)
RDW: 13.8 % (ref 11.5–15.5)

## 2012-10-25 MED ORDER — KETOROLAC TROMETHAMINE 30 MG/ML IJ SOLN
30.0000 mg | Freq: Once | INTRAMUSCULAR | Status: AC
  Administered 2012-10-25: 30 mg via INTRAVENOUS
  Filled 2012-10-25: qty 1

## 2012-10-25 MED ORDER — SODIUM CHLORIDE 0.9 % IV BOLUS (SEPSIS)
1000.0000 mL | Freq: Once | INTRAVENOUS | Status: AC
  Administered 2012-10-25: 1000 mL via INTRAVENOUS

## 2012-10-25 MED ORDER — MORPHINE SULFATE 4 MG/ML IJ SOLN
4.0000 mg | Freq: Once | INTRAMUSCULAR | Status: AC
  Administered 2012-10-25: 4 mg via INTRAVENOUS
  Filled 2012-10-25: qty 1

## 2012-10-25 MED ORDER — LORAZEPAM 2 MG/ML IJ SOLN
1.0000 mg | Freq: Once | INTRAMUSCULAR | Status: AC
  Administered 2012-10-25: 1 mg via INTRAVENOUS
  Filled 2012-10-25: qty 1

## 2012-10-25 MED ORDER — ONDANSETRON HCL 4 MG/2ML IJ SOLN
4.0000 mg | Freq: Once | INTRAMUSCULAR | Status: AC
  Administered 2012-10-25: 4 mg via INTRAVENOUS
  Filled 2012-10-25: qty 2

## 2012-10-25 MED ORDER — GADOBENATE DIMEGLUMINE 529 MG/ML IV SOLN
15.0000 mL | Freq: Once | INTRAVENOUS | Status: AC | PRN
  Administered 2012-10-25: 15 mL via INTRAVENOUS

## 2012-10-25 NOTE — ED Notes (Signed)
In to start iv.  Unable to place.  Another nurse in to try for iv placement.

## 2012-10-25 NOTE — ED Notes (Signed)
MRI to RN stating pt. Not wanting to go into tube for MRI due to anxiety. MD made aware; orders to follow.

## 2012-10-25 NOTE — ED Provider Notes (Signed)
History     CSN: [id number]  Arrival date & time 10/25/12  1513   First MD Initiated Contact with Patient 10/25/12 1545      Chief Complaint  Patient presents with  . Headache    (Consider location/radiation/quality/duration/timing/severity/associated sxs/prior treatment) HPI Comments: Concerned that she may be having another CVA.  Has had one in the past.  No PCP.  Out of blood pressure and DM meds.  Patient is a 51 y.o. female presenting with headaches. The history is provided by the patient. No language interpreter was used.  Headache The primary symptoms include headaches and visual change. Primary symptoms do not include focal weakness, fever, nausea or vomiting. Episode duration: persistent migratory headaches x 2 weeks.    The headache is associated with visual change.    Past Medical History  Diagnosis Date  . Stroke   . Diabetes mellitus   . Hypertension   . Migraine     History reviewed. No pertinent past surgical history.  No family history on file.  History  Substance Use Topics  . Smoking status: Never Smoker   . Smokeless tobacco: Not on file  . Alcohol Use: No    OB History    Grav Para Term Preterm Abortions TAB SAB Ect Mult Living                  Review of Systems  Constitutional: Negative for fever and chills.  Eyes: Positive for visual disturbance.  Gastrointestinal: Negative for nausea and vomiting.  Neurological: Positive for headaches. Negative for focal weakness.  Psychiatric/Behavioral: The patient is nervous/anxious.   All other systems reviewed and are negative.    Allergies  Review of patient's allergies indicates no known allergies.  Home Medications   Current Outpatient Rx  Name  Route  Sig  Dispense  Refill  . CALCIUM CARBONATE ANTACID 500 MG PO CHEW   Oral   Chew 1 tablet by mouth once.         Marland Kitchen HYDROCHLOROTHIAZIDE 25 MG PO TABS   Oral   Take 1 tablet (25 mg total) by mouth daily.   30 tablet   1   .  METFORMIN HCL 500 MG PO TABS   Oral   Take 1 tablet (500 mg total) by mouth 2 (two) times daily with a meal.   60 tablet   1   . POTASSIUM CHLORIDE ER 10 MEQ PO TBCR   Oral   Take 1 tablet (10 mEq total) by mouth 2 (two) times daily.   10 tablet   0     BP 169/100  Pulse 70  Temp 98.2 F (36.8 C) (Oral)  Resp 17  SpO2 98%  Physical Exam  Nursing note and vitals reviewed. Constitutional: She is oriented to person, place, and time. She appears well-developed and well-nourished. No distress.  HENT:  Head: Normocephalic and atraumatic.  Eyes: EOM are normal.  Neck: Normal range of motion.  Cardiovascular: Normal rate and regular rhythm.   Pulmonary/Chest: Effort normal.  Abdominal: Soft. She exhibits no distension. There is no tenderness.  Musculoskeletal: Normal range of motion.  Neurological: She is alert and oriented to person, place, and time. She has normal strength. A cranial nerve deficit is present. No sensory deficit. She displays a negative Romberg sign. GCS eye subscore is 4. GCS verbal subscore is 5. GCS motor subscore is 6.  Skin: Skin is warm and dry.  Psychiatric: She has a normal mood and affect. Judgment normal.  ED Course  Procedures (including critical care time)  Labs Reviewed - No data to display No results found.   1. Headaches due to old head injury   2. Visual disturbances   3. Hypertension       MDM  Send to ED         Evalina Field, PA 10/25/12 1724

## 2012-10-25 NOTE — ED Provider Notes (Signed)
Medical screening examination/treatment/procedure(s) were performed by non-physician practitioner and as supervising physician I was immediately available for consultation/collaboration.  Taylon Coole, M.D.   Marian Meneely C Deke Tilghman, MD 10/25/12 2113 

## 2012-10-25 NOTE — ED Provider Notes (Signed)
History  This chart was scribed for Glynn Octave, MD by Bennett Scrape, ED Scribe. This patient was seen in room A03C/A03C and the patient's care was started at 8:07 PM.   CSN: [id number]  Arrival date & time 10/25/12  1741   First MD Initiated Contact with Patient 10/25/12 2007      Chief Complaint  Patient presents with  . multiple symptoms     The history is provided by the patient. No language interpreter was used.    Brenda Ford is a 51 y.o. female who presents to the Emergency Department complaining of 3 weeks of gradual onset, gradually worsening, constant HA located in the left occipital with associated dizziness described as the room spinning and intermittent blurred vision that she attributes to being off of her HTN medications for 2 to 3 months. She states that she is dizzy currently and reports that it is worse with standing. She states that she is here for evaluation today, because she has been leaning more towards the right for the past 24 hours and is concerning about another possible CVA. She also c/o left foot pain along the bottom. She denies any recent injuries or falls. She reports that she has been taking Advil daily with no improvement. She has nausea, emesis, diarrhea, constipation, SOB, back pain and CP as associated symptoms. She has a h/o DM, HTN and CVA (2010, problems with memory and left-sided weakness). She denies smoking and alcohol use.  She was seen at [facility] and transferred here for further evaluation.   No PCP, moved to [place] 2 years ago. States that she is getting her medications filled in urgent cares and ED.  Past Medical History  Diagnosis Date  . Stroke   . Diabetes mellitus   . Hypertension   . Migraine     History reviewed. No pertinent past surgical history.  No family history on file.  History  Substance Use Topics  . Smoking status: Never Smoker   . Smokeless tobacco: Not on file  . Alcohol Use: No    No OB history  provided.  Review of Systems  A complete 10 system review of systems was obtained and all systems are negative except as noted in the HPI and PMH.    Allergies  Review of patient's allergies indicates no known allergies.  Home Medications   Current Outpatient Rx  Name  Route  Sig  Dispense  Refill  . CALCIUM CARBONATE ANTACID 500 MG PO CHEW   Oral   Chew 1 tablet by mouth once.         Marland Kitchen HYDROCHLOROTHIAZIDE 25 MG PO TABS   Oral   Take 1 tablet (25 mg total) by mouth daily.   30 tablet   1   . METFORMIN HCL 500 MG PO TABS   Oral   Take 1 tablet (500 mg total) by mouth 2 (two) times daily with a meal.   60 tablet   1   . POTASSIUM CHLORIDE ER 10 MEQ PO TBCR   Oral   Take 1 tablet (10 mEq total) by mouth 2 (two) times daily.   10 tablet   0     Triage Vitals: BP 164/96  Pulse 68  Temp 98.2 F (36.8 C) (Oral)  Resp 10  SpO2 100%  Physical Exam  Nursing note and vitals reviewed. Constitutional: She is oriented to person, place, and time. She appears well-developed and well-nourished. No distress.  HENT:  Head: Normocephalic and atraumatic.  Mouth/Throat:  Oropharynx is clear and moist.       Head impulse test is negative  Eyes: Conjunctivae normal and EOM are normal. Pupils are equal, round, and reactive to light.       No nystagmus   Neck: Neck supple. No tracheal deviation present.  Cardiovascular: Normal rate and regular rhythm.   Pulmonary/Chest: Effort normal and breath sounds normal. No respiratory distress.  Abdominal: Soft. There is no tenderness.  Musculoskeletal: Normal range of motion.       Tenderness to the left heel  Neurological: She is alert and oriented to person, place, and time. No cranial nerve deficit.       No pronator's drift, no ataxia with finger to nose, rhomberg's is negative, test of skew is negative  Skin: Skin is warm and dry.  Psychiatric: She has a normal mood and affect. Her behavior is normal.    ED Course  Procedures  (including critical care time)  DIAGNOSTIC STUDIES: Oxygen Saturation is 100% on room air, normal by my interpretation.    COORDINATION OF CARE: 8:13 PM- Discussed treatment plan which includes blood work, MRI and CT head with pt at bedside and pt agreed to plan.  8:20 PM- Ordered 30 mg Toradol injection and 1,000 mL of Bolus.    Labs Reviewed  CBC WITH DIFFERENTIAL - Abnormal; Notable for the following:    RBC 5.15 (*)     MCV 76.1 (*)     MCH 25.2 (*)     All other components within normal limits  URINALYSIS, ROUTINE W REFLEX MICROSCOPIC - Abnormal; Notable for the following:    APPearance CLOUDY (*)     Leukocytes, UA SMALL (*)     All other components within normal limits  COMPREHENSIVE METABOLIC PANEL - Abnormal; Notable for the following:    Glucose, Bld 138 (*)     Total Protein 9.0 (*)     Alkaline Phosphatase 121 (*)     GFR calc non Af Amer 74 (*)     GFR calc Af Amer 86 (*)     All other components within normal limits  URINE MICROSCOPIC-ADD ON - Abnormal; Notable for the following:    Squamous Epithelial / LPF MANY (*)     Bacteria, UA FEW (*)     Crystals CA OXALATE CRYSTALS (*)     All other components within normal limits   No results found.   No diagnosis found.    MDM  History of previous stroke, hypertension, diabetes, noncompliance of medications presenting with 3 weeks of gradual onset headache it is constant. Associated with nausea, vertigo that is worse with position change, nausea and difficulty walking.  Her neurological exam is nonfocal. She has no chest pain or shortness of breath. Romberg is negative. She does have positional dizziness with standing. No nystagmus.  Will obtain imaging of head rule out new infarct.   Date: 10/25/2012  Rate: 65  Rhythm: normal sinus rhythm  QRS Axis: normal  Intervals: PR prolonged  ST/T Wave abnormalities: normal  Conduction Disutrbances:first-degree A-V block   Narrative Interpretation:   Old EKG  Reviewed: unchanged   I personally performed the services described in this documentation, which was scribed in my presence. The recorded information has been reviewed and is accurate.       Glynn Octave, MD 10/25/12 [phone]

## 2012-10-25 NOTE — ED Notes (Signed)
Pt remains in MRI 

## 2012-10-25 NOTE — ED Notes (Signed)
Pt returned from MRI/CT. Pt ambulatory to bed. Pt placed on monitor.

## 2012-10-25 NOTE — ED Notes (Signed)
Pt c/o headache x2 weeks... Sx include: dizziness, blurry vision, nauseas... Headache is getting worse.... Denies: fevers, vomiting, diarrhea... Hx of strokes in [place], HTN, and Migraines.... Does not have PCP... Also c/o left foot pain... Pt is alert and responsive w/no signs of distress.

## 2012-10-25 NOTE — ED Notes (Signed)
For 2-3 months she has not had her bp meds.  C/o a headache dizziness no appetite and lt heel pain also with no  Injury.  This is a ucc transfer

## 2012-10-25 NOTE — ED Provider Notes (Signed)
10:22 PM Patient has been moved to CDU holding for MRI brain.  Sign out received from Dr Manus Gunning.  Pt with three weeks of headache and complaints of blurry vision, dizziness.   Pt also has HTN and DM, is noncompliant with her medications.  Plan is for MRI.  If negative, pt may be d/c home.  Pt currently in MRI.    11:16 PM Pt has just returned from MRI.  States her headache pain is 8/10 intensity.  Will give morphine/zofran.  Awaiting MRI results.    11:36 PM MRI reviewed by Dr Manus Gunning.  Discussed result.  I have consulted neurology per his recommendation.  Dr Amada Jupiter recommends CT angio for more information to determine therapy.  He will see the patient in CDU.    11:55 PM Dr Mariam Dollar, radiology, requests call from Dr Amada Jupiter to discuss imaging ordered.  I have asked secretary to connect the two.    11:59 PM Dr Amada Jupiter to review MRI.  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     12:36 AM Dr Amada Jupiter to see patient in consult.  Pt signed out to Dr Hyacinth Meeker, Erenest Blank, who assumes care of patient overnight.    Results for orders placed during the hospital encounter of 10/25/12  CBC WITH DIFFERENTIAL      Component Value Range   WBC 8.5  4.0 - 10.5 K/uL   RBC 5.15 (*) 3.87 - 5.11 MIL/uL   Hemoglobin 13.0  12.0 - 15.0 g/dL   HCT 29.5  62.1 - 30.8 %   MCV 76.1 (*) 78.0 - 100.0 fL   MCH 25.2 (*) 26.0 - 34.0 pg   MCHC 33.2  30.0 - 36.0 g/dL   RDW 65.7  84.6 - 96.2 %   Platelets 325  150 - 400 K/uL   Neutrophils Relative 64  43 - 77 %   Neutro Abs 5.4  1.7 - 7.7 K/uL   Lymphocytes Relative 30  12 - 46 %   Lymphs Abs 2.5  0.7 - 4.0 K/uL   Monocytes Relative 6  3 - 12 %   Monocytes Absolute 0.5  0.1 - 1.0 K/uL   Eosinophils Relative 1  0 - 5 %   Eosinophils Absolute 0.1  0.0 - 0.7 K/uL   Basophils Relative 0  0 - 1 %   Basophils Absolute 0.0  0.0 - 0.1 K/uL  URINALYSIS, ROUTINE W REFLEX MICROSCOPIC      Component Value Range   Color, Urine YELLOW   YELLOW   APPearance CLOUDY (*) CLEAR   Specific Gravity, Urine 1.024  1.005 - 1.030   pH 6.0  5.0 - 8.0   Glucose, UA NEGATIVE  NEGATIVE mg/dL   Hgb urine dipstick NEGATIVE  NEGATIVE   Bilirubin Urine NEGATIVE  NEGATIVE   Ketones, ur NEGATIVE  NEGATIVE mg/dL   Protein, ur NEGATIVE  NEGATIVE mg/dL   Urobilinogen, UA 0.2  0.0 - 1.0 mg/dL   Nitrite NEGATIVE  NEGATIVE   Leukocytes, UA SMALL (*) NEGATIVE  COMPREHENSIVE METABOLIC PANEL      Component Value Range   Sodium 137  135 - 145 mEq/L   Potassium 3.8  3.5 - 5.1 mEq/L   Chloride 98  96 - 112 mEq/L   CO2 29  19 - 32 mEq/L   Glucose, Bld 138 (*) 70 - 99 mg/dL   BUN 11  6 - 23 mg/dL   Creatinine, Ser 9.52  0.50 - 1.10 mg/dL   Calcium 10.5  8.4 - 10.5 mg/dL   Total Protein 9.0 (*) 6.0 - 8.3 g/dL   Albumin 4.2  3.5 - 5.2 g/dL   AST 26  0 - 37 U/L   ALT 25  0 - 35 U/L   Alkaline Phosphatase 121 (*) 39 - 117 U/L   Total Bilirubin 0.5  0.3 - 1.2 mg/dL   GFR calc non Af Amer 74 (*) >90 mL/min   GFR calc Af Amer 86 (*) >90 mL/min  URINE MICROSCOPIC-ADD ON      Component Value Range   Squamous Epithelial / LPF MANY (*) RARE   WBC, UA 0-2  <3 WBC/hpf   RBC / HPF 0-2  <3 RBC/hpf   Bacteria, UA FEW (*) RARE   Crystals CA OXALATE CRYSTALS (*) NEGATIVE   Urine-Other MUCOUS PRESENT    TROPONIN I      Component Value Range   Troponin I <0.30  <0.30 ng/mL   Ct Head Wo Contrast  10/25/2012  *RADIOLOGY REPORT*  Clinical Data: Headache and dizziness.  CT HEAD WITHOUT CONTRAST  Technique:  Contiguous axial images were obtained from the base of the skull through the vertex without contrast.  Comparison: MRI performed earlier.  Findings: Remote left cerebellar infarct.  No acute stroke or bleed.  No mass lesions, hydrocephalus, or extra-axial fluid. Slight premature atrophy.  Chronic microvascular ischemic change. Calvarium intact.  Clear sinuses and mastoids.  IMPRESSION: Chronic left cerebellar infarct.  No acute intracranial findings.    Original Report Authenticated By: Davonna Belling, M.D.    Mr [facility] Wo Contrast  10/25/2012  *RADIOLOGY REPORT*  Clinical Data:    Dizziness, headache. History of hypertension. History of diabetes.  Noncompliant medications.  History of previous stroke.  MRI HEAD WITHOUT AND WITH CONTRAST MRA HEAD WITHOUT CONTRAST MRA NECK WITHOUT AND WITH CONTRAST  Technique: Multiplanar, multiecho pulse sequences of the brain and surrounding structures were obtained according to standard protocol without and with intravenous contrast.  Angiographic images of the Circle of Willis were obtained using MRA technique without intravenous contrast.  Angiographic images of the neck were obtained using MRA technique without and with intravenous contrast.  Contrast: MultiHance 15 ml.  Comparison: CT head 03/09/2011.  MRI HEAD WITHOUT AND WITH CONTRAST  Findings:  Remote PICA territory inferior cerebellar infarct on the left.   No acute stroke or bleed.  Mild premature atrophy.  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 Post infusion, no abnormal intracranial enhancement.  The visualized calvarium and skull base are intact. Negative orbits, sinuses and mastoids.  No pituitary or cerebellar tonsillar abnormality.  Upper cervical region unremarkable. Compared with 2012, the chronic infarct was present at that time.  IMPRESSION: Remote left cerebellar infarct.  No evidence for acute infarction. Premature atrophy and chronic microvascular ischemic change.  MRA HEAD WITHOUT CONTRAST  Findings:  Patent internal carotid arteries but dolichoectatic consistent with history of hypertension.  Also dolichoectatic basilar artery.  Right vertebral is the sole contributor to the basilar.  Left vertebral does not fill antegrade but appears to reconstitute over a 5 mm segment with slight retrograde flow. There is no proximal stenosis of the  anterior, middle, there is no proximal stenosis of the middle cerebral arteries although mild nonstenotic irregularity can be seen bilaterally. Distal MCA branches show irregularity but are patent.  The right A1 ACA is the dominant contributor to the anterior cerebral arteries.  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  The proximal anterior cerebral artery distal to the anterior communicating appears azygos.  Mild nonstenotic irregularity can be seen in the ambient segment the right PCA.  Left PCA appears unremarkable.  No cerebellar branch occlusion except for left PICA which is not seen.  IMPRESSION: Intracranial atherosclerotic changes as described. 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    Left vertebral in the neck and skull base not patent with only a short segment reconstitution distally. Nonfilling left PICA.   MRA NECK WITHOUT AND WITH CONTRAST  Findings:  Bovine origin.  Dolichoectatic proximal vasculature without focal stenosis.  Nonstenotic Right carotid bifurcation.   Tortuous cervical ICA without dissection or fibromuscular change.  Nonstenotic left carotid bifurcation.  Moderately tortuous cervical ICA without dissection or muscular change.  Right vertebral widely patent throughout its course without ostial stenosis.  Left vertebral is small and  is occluded 2 cm from its origin.  There may be faint  reconstitution distally at the skull base, but there are multiple segmental areas of irregularity.  IMPRESSION: No extracranial carotid disease.  Right vertebral dominant and sole contributor to the basilar.  Severe disease of the left vertebral.  There is no continuous vessel from its origin to the intracranial compartment.  Most of this vessel is occluded.   Original Report Authenticated By: Davonna Belling, M.D.    Mr Angiogram Neck W Wo Contrast  10/25/2012  *RADIOLOGY REPORT*  Clinical Data:    Dizziness, headache. History of  hypertension. History of diabetes.  Noncompliant medications.  History of previous stroke.  MRI HEAD WITHOUT AND WITH CONTRAST MRA HEAD WITHOUT CONTRAST MRA NECK WITHOUT AND WITH CONTRAST  Technique: Multiplanar, multiecho pulse sequences of the brain and surrounding structures were obtained according to standard protocol without and with intravenous contrast.  Angiographic images of the Circle of Willis were obtained using MRA technique without intravenous contrast.  Angiographic images of the neck were obtained using MRA technique without and with intravenous contrast.  Contrast: MultiHance 15 ml.  Comparison: CT head 03/09/2011.  MRI HEAD WITHOUT AND WITH CONTRAST  Findings:  Remote PICA territory inferior cerebellar infarct on the left.   No acute stroke or bleed.  Mild premature atrophy.  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 Post infusion, no abnormal intracranial enhancement.  The visualized calvarium and skull base are intact. Negative orbits, sinuses and mastoids.  No pituitary or cerebellar tonsillar abnormality.  Upper cervical region unremarkable. Compared with 2012, the chronic infarct was present at that time.  IMPRESSION: Remote left cerebellar infarct.  No evidence for acute infarction. Premature atrophy and chronic microvascular ischemic change.  MRA HEAD WITHOUT CONTRAST  Findings:  Patent internal carotid arteries but dolichoectatic consistent with history of hypertension.  Also dolichoectatic basilar artery.  Right vertebral is the sole contributor to the basilar.  Left vertebral does not fill antegrade but appears to reconstitute over a 5 mm segment with slight retrograde flow. There is no proximal stenosis of the anterior, middle, there is no proximal stenosis of the middle cerebral arteries although mild nonstenotic irregularity can be seen bilaterally. Distal MCA branches  show irregularity but are patent.  The right A1 ACA is the dominant contributor to the anterior cerebral arteries.  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  The proximal anterior cerebral artery distal to the anterior communicating appears azygos.  Mild nonstenotic irregularity can be seen in the ambient segment the right PCA.  Left PCA appears unremarkable.  No cerebellar branch occlusion except for left PICA which is not seen.  IMPRESSION: Intracranial atherosclerotic changes as described. 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    Left vertebral in the neck and skull base not patent with only a short segment reconstitution distally. Nonfilling left PICA.   MRA NECK WITHOUT AND WITH CONTRAST  Findings:  Bovine origin.  Dolichoectatic proximal vasculature without focal stenosis.  Nonstenotic Right carotid bifurcation.   Tortuous cervical ICA without dissection or fibromuscular change.  Nonstenotic left carotid bifurcation.  Moderately tortuous cervical ICA without dissection or muscular change.  Right vertebral widely patent throughout its course without ostial stenosis.  Left vertebral is small and  is occluded 2 cm from its origin.  There may be faint  reconstitution distally at the skull base, but there are multiple segmental areas of irregularity.  IMPRESSION: No extracranial carotid disease.  Right vertebral dominant and sole contributor to the basilar.  Severe disease of the left vertebral.  There is no continuous vessel from its origin to the intracranial compartment.  Most of this vessel is occluded.   Original Report Authenticated By: Davonna Belling, M.D.    Mr [facility] Wo Contrast  10/25/2012  *RADIOLOGY REPORT*  Clinical Data:    Dizziness, headache. History of hypertension. History of diabetes.  Noncompliant medications.  History of previous stroke.  MRI HEAD WITHOUT AND WITH CONTRAST MRA HEAD WITHOUT CONTRAST MRA NECK WITHOUT AND WITH  CONTRAST  Technique: Multiplanar, multiecho pulse sequences of the brain and surrounding structures were obtained according to standard protocol without and with intravenous contrast.  Angiographic images of the Circle of Willis were obtained using MRA technique without intravenous contrast.  Angiographic images of the neck were obtained using MRA technique without and with intravenous contrast.  Contrast: MultiHance 15 ml.  Comparison: CT head 03/09/2011.  MRI HEAD WITHOUT AND WITH CONTRAST  Findings:  Remote PICA territory inferior cerebellar infarct on the left.   No acute stroke or bleed.  Mild premature atrophy.  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 Post infusion, no abnormal intracranial enhancement.  The visualized calvarium and skull base are intact. Negative orbits, sinuses and mastoids.  No pituitary or cerebellar tonsillar abnormality.  Upper cervical region unremarkable. Compared with 2012, the chronic infarct was present at that time.  IMPRESSION: Remote left cerebellar infarct.  No evidence for acute infarction. Premature atrophy and chronic microvascular ischemic change.  MRA HEAD WITHOUT CONTRAST  Findings:  Patent internal carotid arteries but dolichoectatic consistent with history of hypertension.  Also dolichoectatic basilar artery.  Right vertebral is the sole contributor to the basilar.  Left vertebral does not fill antegrade but appears to reconstitute over a 5 mm segment with slight retrograde flow. There is no proximal stenosis of the anterior, middle, there is no proximal stenosis of the middle cerebral arteries although mild nonstenotic irregularity can be seen bilaterally. Distal MCA branches show irregularity but are patent.  The right A1 ACA is the dominant contributor to the anterior cerebral arteries.  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  The proximal anterior  cerebral artery distal to the anterior communicating appears azygos.  Mild nonstenotic irregularity can be seen in the ambient segment the right PCA.  Left PCA appears unremarkable.  No cerebellar branch occlusion except for left PICA which is not seen.  IMPRESSION: Intracranial atherosclerotic changes as described. 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    Left vertebral in the neck and skull base not patent with only a short segment reconstitution distally. Nonfilling left PICA.   MRA NECK WITHOUT AND WITH CONTRAST  Findings:  Bovine origin.  Dolichoectatic proximal vasculature without focal stenosis.  Nonstenotic Right carotid bifurcation.   Tortuous cervical ICA without dissection or fibromuscular change.  Nonstenotic left carotid bifurcation.  Moderately tortuous cervical ICA without dissection or muscular change.  Right vertebral widely patent throughout its course without ostial stenosis.  Left vertebral is small and  is occluded 2 cm from its origin.  There may be faint  reconstitution distally at the skull base, but there are multiple segmental areas of irregularity.  IMPRESSION: No extracranial carotid disease.  Right vertebral dominant and sole contributor to the basilar.  Severe disease of the left vertebral.  There is no continuous vessel from its origin to the intracranial compartment.  Most of this vessel is occluded.   Original Report Authenticated By: Davonna Belling, M.D.       [place], [place] 10/26/12 [phone]

## 2012-10-26 ENCOUNTER — Encounter (HOSPITAL_COMMUNITY): Payer: Self-pay | Admitting: Internal Medicine

## 2012-10-26 ENCOUNTER — Emergency Department (HOSPITAL_COMMUNITY): Payer: Medicare (Managed Care)

## 2012-10-26 ENCOUNTER — Observation Stay (HOSPITAL_COMMUNITY): Payer: Medicare (Managed Care)

## 2012-10-26 DIAGNOSIS — I10 Essential (primary) hypertension: Secondary | ICD-10-CM | POA: Diagnosis present

## 2012-10-26 DIAGNOSIS — M79609 Pain in unspecified limb: Secondary | ICD-10-CM

## 2012-10-26 DIAGNOSIS — R6889 Other general symptoms and signs: Secondary | ICD-10-CM

## 2012-10-26 DIAGNOSIS — R42 Dizziness and giddiness: Secondary | ICD-10-CM

## 2012-10-26 DIAGNOSIS — R011 Cardiac murmur, unspecified: Secondary | ICD-10-CM | POA: Insufficient documentation

## 2012-10-26 DIAGNOSIS — E785 Hyperlipidemia, unspecified: Secondary | ICD-10-CM | POA: Diagnosis present

## 2012-10-26 DIAGNOSIS — R51 Headache: Secondary | ICD-10-CM

## 2012-10-26 DIAGNOSIS — I679 Cerebrovascular disease, unspecified: Secondary | ICD-10-CM | POA: Diagnosis present

## 2012-10-26 DIAGNOSIS — E119 Type 2 diabetes mellitus without complications: Secondary | ICD-10-CM

## 2012-10-26 DIAGNOSIS — M79672 Pain in left foot: Secondary | ICD-10-CM | POA: Diagnosis present

## 2012-10-26 LAB — LIPID PANEL
Cholesterol: 199 mg/dL (ref 0–200)
LDL Cholesterol: 105 mg/dL — ABNORMAL HIGH (ref 0–99)
Triglycerides: 126 mg/dL (ref <150)

## 2012-10-26 LAB — GLUCOSE, CAPILLARY
Glucose-Capillary: 253 mg/dL — ABNORMAL HIGH (ref 70–99)
Glucose-Capillary: 174 mg/dL — ABNORMAL HIGH (ref 70–99)
Glucose-Capillary: 167 mg/dL — ABNORMAL HIGH (ref 70–99)

## 2012-10-26 LAB — URIC ACID: Uric Acid, Serum: 5.1 mg/dL (ref 2.4–7.0)

## 2012-10-26 LAB — HEMOGLOBIN A1C
Hgb A1c MFr Bld: 8.6 % — ABNORMAL HIGH (ref <5.7)
Mean Plasma Glucose: 200 mg/dL — ABNORMAL HIGH (ref <117)

## 2012-10-26 MED ORDER — ENOXAPARIN SODIUM 40 MG/0.4ML ~~LOC~~ SOLN
40.0000 mg | SUBCUTANEOUS | Status: DC
  Administered 2012-10-26 – 2012-10-30 (×5): 40 mg via SUBCUTANEOUS
  Filled 2012-10-26 (×5): qty 0.4

## 2012-10-26 MED ORDER — ONDANSETRON HCL 4 MG/2ML IJ SOLN: 4.0000 mg | Freq: Four times a day (QID) | INTRAMUSCULAR | Status: DC | PRN

## 2012-10-26 MED ORDER — INSULIN ASPART 100 UNIT/ML ~~LOC~~ SOLN: 0.0000 [IU] | Freq: Every day | SUBCUTANEOUS | Status: DC

## 2012-10-26 MED ORDER — VALPROATE SODIUM 500 MG/5ML IV SOLN
1000.0000 mg | Freq: Once | INTRAVENOUS | Status: AC
  Administered 2012-10-26: 1000 mg via INTRAVENOUS
  Filled 2012-10-26: qty 10

## 2012-10-26 MED ORDER — SODIUM CHLORIDE 0.9 % IJ SOLN
3.0000 mL | INTRAMUSCULAR | Status: DC | PRN
  Administered 2012-10-27: 3 mL via INTRAVENOUS

## 2012-10-26 MED ORDER — IOHEXOL 350 MG/ML SOLN
100.0000 mL | Freq: Once | INTRAVENOUS | Status: AC | PRN
  Administered 2012-10-26: 100 mL via INTRAVENOUS

## 2012-10-26 MED ORDER — ASPIRIN EC 325 MG PO TBEC
325.0000 mg | DELAYED_RELEASE_TABLET | Freq: Every day | ORAL | Status: DC
  Administered 2012-10-26 – 2012-10-30 (×5): 325 mg via ORAL
  Filled 2012-10-26 (×5): qty 1

## 2012-10-26 MED ORDER — ALUM & MAG HYDROXIDE-SIMETH 200-200-20 MG/5ML PO SUSP: 30.0000 mL | Freq: Four times a day (QID) | ORAL | Status: DC | PRN

## 2012-10-26 MED ORDER — HYDRALAZINE HCL 20 MG/ML IJ SOLN
5.0000 mg | Freq: Four times a day (QID) | INTRAMUSCULAR | Status: DC | PRN
  Administered 2012-10-26 – 2012-10-27 (×2): 5 mg via INTRAVENOUS
  Filled 2012-10-26 (×2): qty 1

## 2012-10-26 MED ORDER — PNEUMOCOCCAL VAC POLYVALENT 25 MCG/0.5ML IJ INJ
0.5000 mL | INJECTION | INTRAMUSCULAR | Status: AC
  Administered 2012-10-27: 0.5 mL via INTRAMUSCULAR
  Filled 2012-10-26: qty 0.5

## 2012-10-26 MED ORDER — SODIUM CHLORIDE 0.9 % IV SOLN: 250.0000 mL | INTRAVENOUS | Status: DC | PRN

## 2012-10-26 MED ORDER — HYDROMORPHONE HCL PF 1 MG/ML IJ SOLN
0.5000 mg | INTRAMUSCULAR | Status: DC | PRN
  Administered 2012-10-26 – 2012-10-28 (×5): 1 mg via INTRAVENOUS
  Filled 2012-10-26 (×5): qty 1

## 2012-10-26 MED ORDER — OXYCODONE HCL 5 MG PO TABS
5.0000 mg | ORAL_TABLET | ORAL | Status: DC | PRN
  Administered 2012-10-26 – 2012-10-27 (×4): 5 mg via ORAL
  Filled 2012-10-26 (×4): qty 1

## 2012-10-26 MED ORDER — SODIUM CHLORIDE 0.9 % IJ SOLN
3.0000 mL | Freq: Two times a day (BID) | INTRAMUSCULAR | Status: DC
  Administered 2012-10-26 – 2012-10-30 (×8): 3 mL via INTRAVENOUS

## 2012-10-26 MED ORDER — MECLIZINE HCL 25 MG PO TABS
25.0000 mg | ORAL_TABLET | Freq: Once | ORAL | Status: AC
  Administered 2012-10-26: 25 mg via ORAL
  Filled 2012-10-26: qty 1

## 2012-10-26 MED ORDER — ONDANSETRON HCL 4 MG PO TABS: 4.0000 mg | ORAL_TABLET | Freq: Four times a day (QID) | ORAL | Status: DC | PRN

## 2012-10-26 MED ORDER — HYDROMORPHONE HCL PF 1 MG/ML IJ SOLN
1.0000 mg | INTRAMUSCULAR | Status: DC | PRN
  Administered 2012-10-26: 1 mg via INTRAVENOUS
  Filled 2012-10-26: qty 1

## 2012-10-26 MED ORDER — INFLUENZA VIRUS VACC SPLIT PF IM SUSP
0.5000 mL | INTRAMUSCULAR | Status: AC
  Administered 2012-10-27: 0.5 mL via INTRAMUSCULAR
  Filled 2012-10-26: qty 0.5

## 2012-10-26 MED ORDER — HYDROCHLOROTHIAZIDE 25 MG PO TABS: 25.0000 mg | ORAL_TABLET | Freq: Every day | ORAL | Status: DC

## 2012-10-26 MED ORDER — SODIUM CHLORIDE 0.9 % IJ SOLN
3.0000 mL | Freq: Two times a day (BID) | INTRAMUSCULAR | Status: DC
  Administered 2012-10-26 – 2012-10-29 (×5): 3 mL via INTRAVENOUS

## 2012-10-26 MED ORDER — MECLIZINE HCL 25 MG PO TABS: 25.0000 mg | ORAL_TABLET | Freq: Three times a day (TID) | ORAL | Status: DC | PRN

## 2012-10-26 MED ORDER — ZOLPIDEM TARTRATE 5 MG PO TABS: 5.0000 mg | ORAL_TABLET | Freq: Every evening | ORAL | Status: DC | PRN

## 2012-10-26 MED ORDER — ACETAMINOPHEN 650 MG RE SUPP: 650.0000 mg | Freq: Four times a day (QID) | RECTAL | Status: DC | PRN

## 2012-10-26 MED ORDER — ONDANSETRON HCL 4 MG/2ML IJ SOLN: 4.0000 mg | Freq: Three times a day (TID) | INTRAMUSCULAR | Status: DC | PRN

## 2012-10-26 MED ORDER — ACETAMINOPHEN 325 MG PO TABS: 650.0000 mg | ORAL_TABLET | Freq: Four times a day (QID) | ORAL | Status: DC | PRN

## 2012-10-26 MED ORDER — INSULIN ASPART 100 UNIT/ML ~~LOC~~ SOLN
0.0000 [IU] | Freq: Three times a day (TID) | SUBCUTANEOUS | Status: DC
  Administered 2012-10-26: 2 [IU] via SUBCUTANEOUS
  Administered 2012-10-26: 3 [IU] via SUBCUTANEOUS
  Administered 2012-10-27 – 2012-10-29 (×7): 2 [IU] via SUBCUTANEOUS
  Administered 2012-10-30 (×2): 1 [IU] via SUBCUTANEOUS

## 2012-10-26 NOTE — Consult Note (Signed)
Reason for Consult:Dizziness, headache Referring Physician: Gwynneth Aliment  CC: Headache, Dizziness  History is obtained from:patient  HPI: Brenda Ford is a 51 y.o. female with a hsitory of previous stroke in 2010 who has been having an occipital headache in association with intermittent vertigo, vision changes, and nausea. These symptoms get worse if the patient stands up and starts to walk around. She states that the symptoms have been going on for the same duration as the headache. 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 Neurology was consulted for further management. She is currently essentially house bound because if she gets up to move, she has vision changes and vertigo.   Also, The patient has had left calf/leg pain for the past couple of weeks. She states that it is making it hard for her to walk on it.    ROS: A 14 point ROS was performed and is negative except as noted in the HPI.  Past Medical History  Diagnosis Date  . Stroke   . Diabetes mellitus   . Hypertension   . Migraine     Family History: stroke  Social History: Tob: Never smoker  Exam: Current vital signs: BP 172/99  Pulse 58  Temp 98.2 F (36.8 C) (Oral)  Resp 15  SpO2 96% Vital signs in last 24 hours: Temp:  [98.2 F (36.8 C)] 98.2 F (36.8 C) (11/15 1752) Pulse Rate:  [54-74] 58  (11/16 0100) Resp:  [10-18] 15  (11/16 0100) BP: (161-186)/(88-100) 172/99 mmHg (11/16 0100) SpO2:  [96 %-100 %] 96 % (11/16 0100)  General: In bed, NAD CV: RRR Mental Status: Patient is awake, alert, oriented to person, place, month, year, and situation. Immediate and remote memory are intact. Patient is able to give a clear and coherent history. Able to give # of quarters in $2.75 Cranial Nerves: II: Visual Fields are full. Pupils are equal, round, and reactive to light.  Discs are sharp. III,IV, VI: EOMI without ptosis or diploplia.  V: Facial sensation is symmetric  to temperature VII: Facial movement is symmetric.  VIII: hearing is intact to voice X: Uvula elevates symmetrically XI: Shoulder shrug is symmetric. XII: tongue is midline without atrophy or fasciculations.  Motor: Tone is normal. Bulk is normal. 5/5 strength was present in all four extremities.  Sensory: Sensation is symmetric to light touch and temperature in the arms and legs. Deep Tendon Reflexes: 2+ and symmetric in the biceps and patellae.  Plantars: Toes are downgoing bilaterally.  Cerebellar: FNF and HKS are intact bilaterally Gait: Did not assess 2/2 patient safety concerns.   I have reviewed labs in epic and the results pertinent to this consultation are: CMP - unreamrkable CBC - unremarkable.   I have reviewed the images obtained:MRI brain - no stroke. MRA, there does appear to be low flow in the left vertebral artery.   Impression: 51 yo F with 3 weeks of occipital headache and vertigo/vision changes that worsen with standing. 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 Given teh acute onset, a vertebral dissection would be in the differential. A CT angiogram may be able to better delineate the vasculature of the left vertebral and if dissection was found, then anticoagulation would be indicated. Also, I advised her to not stop taking ASA again without first consulting a physician.   Recommendations: 1) CTA head and neck to assess for dissection.  2) Doppler left leg to assess for DVT.  3) Restart  ASA, other home meds including statin 4) I have asked for records from previous stroke admission to be faxed and placed on the chart.    Ritta Slot, MD [facility] [phone]  If 7pm- 7am, please page neurology on call at [phone].

## 2012-10-26 NOTE — Progress Notes (Signed)
Md called re: change order for calcaneum x ray  Instead of Left  leg x ray. Also there is a questionable murmur heard on the pulmonic aspect. Pt does not have a history of echo done lately and she has had a history of migraiones and stroke in the past. Thanks!  Ancil Linsey RN

## 2012-10-26 NOTE — Progress Notes (Signed)
Stroke Team Progress Note  HISTORY  Brenda Ford is a 51 y.o. female with a hsitory of previous stroke in 2010 who has been having an occipital headache in association with intermittent vertigo, vision changes, and nausea. These symptoms get worse if the patient stands up and starts to walk around. She states that the symptoms have been going on for the same duration as the headache. 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 Neurology was consulted for further management. She is currently essentially house bound because if she gets up to move, she has vision changes and vertigo.  Also, The patient has had left calf/leg pain for the past couple of weeks. She states that it is making it hard for her to walk on it.   SUBJECTIVE No family is at bedside. The patient notes a generalize headache with some blurred vision and dizziness.   OBJECTIVE Most recent Vital Signs: Temp: 98.3 F (36.8 C) (11/16 0824) Temp src: Oral (11/16 0824) BP: 150/84 mmHg (11/16 0824) Pulse Rate: 71  (11/16 0824) Respiratory Rate: 18 O2 Saturation: 97%  CBG (last 3)  Basename 10/26/12 0912  GLUCAP 253*   Intake/Output from previous day:    IV Fluid Intake:    Medications    . aspirin EC  325 mg Oral Daily  . enoxaparin (LOVENOX) injection  40 mg Subcutaneous Q24H  . influenza  inactive virus vaccine  0.5 mL Intramuscular Tomorrow-1000  . insulin aspart  0-5 Units Subcutaneous QHS  . insulin aspart  0-9 Units Subcutaneous TID WC  . [COMPLETED] ketorolac  30 mg Intravenous Once  . [COMPLETED] LORazepam  1 mg Intravenous Once  . [COMPLETED] meclizine  25 mg Oral Once  . [COMPLETED]  morphine injection  4 mg Intravenous Once  . [COMPLETED] ondansetron (ZOFRAN) IV  4 mg Intravenous Once  . pneumococcal 23 valent vaccine  0.5 mL Intramuscular Tomorrow-1000  . [COMPLETED] sodium chloride  1,000 mL Intravenous Once  . sodium chloride  3 mL Intravenous Q12H  . sodium  chloride  3 mL Intravenous Q12H  . valproate sodium  1,000 mg Intravenous Once  PRN sodium chloride, acetaminophen, acetaminophen, alum & mag hydroxide-simeth, [COMPLETED] gadobenate dimeglumine, hydrALAZINE, HYDROmorphone (DILAUDID) injection, [COMPLETED] iohexol, meclizine, ondansetron (ZOFRAN) IV, ondansetron, oxyCODONE, sodium chloride, zolpidem, [DISCONTINUED]  HYDROmorphone (DILAUDID) injection, [DISCONTINUED] ondansetron (ZOFRAN) IV  Diet:  General tin liquids Activity: Bathroom privileges DVT Prophylaxis:  Lovenox  Significant Diagnostic Studies: CBC    Component Value Date/Time   WBC 8.5 10/25/2012 1834   RBC 5.15* 10/25/2012 1834   HGB 13.0 10/25/2012 1834   HCT 39.2 10/25/2012 1834   PLT 325 10/25/2012 1834   MCV 76.1* 10/25/2012 1834   MCH 25.2* 10/25/2012 1834   MCHC 33.2 10/25/2012 1834   RDW 13.8 10/25/2012 1834   LYMPHSABS 2.5 10/25/2012 1834   MONOABS 0.5 10/25/2012 1834   EOSABS 0.1 10/25/2012 1834   BASOSABS 0.0 10/25/2012 1834   CMP    Component Value Date/Time   NA 137 10/25/2012 1834   K 3.8 10/25/2012 1834   CL 98 10/25/2012 1834   CO2 29 10/25/2012 1834   GLUCOSE 138* 10/25/2012 1834   BUN 11 10/25/2012 1834   CREATININE 0.89 10/25/2012 1834   CALCIUM 10.5 10/25/2012 1834   PROT 9.0* 10/25/2012 1834   ALBUMIN 4.2 10/25/2012 1834   AST 26 10/25/2012 1834   ALT 25 10/25/2012 1834   ALKPHOS 121* 10/25/2012 1834   BILITOT 0.5  10/25/2012 1834   GFRNONAA 74* 10/25/2012 1834   GFRAA 86* 10/25/2012 1834   COAGS No results found for this basename: INR, PROTIME   Lipid Panel    Component Value Date/Time   CHOL 199 10/26/2012 0826   TRIG 126 10/26/2012 0826   HDL 69 10/26/2012 0826   CHOLHDL 2.9 10/26/2012 0826   VLDL 25 10/26/2012 0826   LDLCALC 105* 10/26/2012 0826   HgbA1C  No results found for this basename: HGBA1C   Urine Drug Screen  No results found for this basename: labopia, cocainscrnur, labbenz, amphetmu, thcu, labbarb      Alcohol Level No results found for this basename: eth     Results for orders placed during the hospital encounter of 10/25/12 (from the past 24 hour(s))  URINALYSIS, ROUTINE W REFLEX MICROSCOPIC     Status: Abnormal   Collection Time   10/25/12  6:08 PM      Component Value Range   Color, Urine YELLOW  YELLOW   APPearance CLOUDY (*) CLEAR   Specific Gravity, Urine 1.024  1.005 - 1.030   pH 6.0  5.0 - 8.0   Glucose, UA NEGATIVE  NEGATIVE mg/dL   Hgb urine dipstick NEGATIVE  NEGATIVE   Bilirubin Urine NEGATIVE  NEGATIVE   Ketones, ur NEGATIVE  NEGATIVE mg/dL   Protein, ur NEGATIVE  NEGATIVE mg/dL   Urobilinogen, UA 0.2  0.0 - 1.0 mg/dL   Nitrite NEGATIVE  NEGATIVE   Leukocytes, UA SMALL (*) NEGATIVE  URINE MICROSCOPIC-ADD ON     Status: Abnormal   Collection Time   10/25/12  6:08 PM      Component Value Range   Squamous Epithelial / LPF MANY (*) RARE   WBC, UA 0-2  <3 WBC/hpf   RBC / HPF 0-2  <3 RBC/hpf   Bacteria, UA FEW (*) RARE   Crystals CA OXALATE CRYSTALS (*) NEGATIVE   Urine-Other MUCOUS PRESENT    CBC WITH DIFFERENTIAL     Status: Abnormal   Collection Time   10/25/12  6:34 PM      Component Value Range   WBC 8.5  4.0 - 10.5 K/uL   RBC 5.15 (*) 3.87 - 5.11 MIL/uL   Hemoglobin 13.0  12.0 - 15.0 g/dL   HCT 16.1  09.6 - 04.5 %   MCV 76.1 (*) 78.0 - 100.0 fL   MCH 25.2 (*) 26.0 - 34.0 pg   MCHC 33.2  30.0 - 36.0 g/dL   RDW 40.9  81.1 - 91.4 %   Platelets 325  150 - 400 K/uL   Neutrophils Relative 64  43 - 77 %   Neutro Abs 5.4  1.7 - 7.7 K/uL   Lymphocytes Relative 30  12 - 46 %   Lymphs Abs 2.5  0.7 - 4.0 K/uL   Monocytes Relative 6  3 - 12 %   Monocytes Absolute 0.5  0.1 - 1.0 K/uL   Eosinophils Relative 1  0 - 5 %   Eosinophils Absolute 0.1  0.0 - 0.7 K/uL   Basophils Relative 0  0 - 1 %   Basophils Absolute 0.0  0.0 - 0.1 K/uL  COMPREHENSIVE METABOLIC PANEL     Status: Abnormal   Collection Time   10/25/12  6:34 PM      Component Value Range   Sodium  137  135 - 145 mEq/L   Potassium 3.8  3.5 - 5.1 mEq/L   Chloride 98  96 - 112 mEq/L   CO2  29  19 - 32 mEq/L   Glucose, Bld 138 (*) 70 - 99 mg/dL   BUN 11  6 - 23 mg/dL   Creatinine, Ser 9.60  0.50 - 1.10 mg/dL   Calcium 45.4  8.4 - 09.8 mg/dL   Total Protein 9.0 (*) 6.0 - 8.3 g/dL   Albumin 4.2  3.5 - 5.2 g/dL   AST 26  0 - 37 U/L   ALT 25  0 - 35 U/L   Alkaline Phosphatase 121 (*) 39 - 117 U/L   Total Bilirubin 0.5  0.3 - 1.2 mg/dL   GFR calc non Af Amer 74 (*) >90 mL/min   GFR calc Af Amer 86 (*) >90 mL/min  TROPONIN I     Status: Normal   Collection Time   10/25/12  8:21 PM      Component Value Range   Troponin I <0.30  <0.30 ng/mL  URIC ACID     Status: Normal   Collection Time   10/26/12  8:19 AM      Component Value Range   Uric Acid, Serum 5.1  2.4 - 7.0 mg/dL  LIPID PANEL     Status: Abnormal   Collection Time   10/26/12  8:26 AM      Component Value Range   Cholesterol 199  0 - 200 mg/dL   Triglycerides 119  <147 mg/dL   HDL 69  >82 mg/dL   Total CHOL/HDL Ratio 2.9     VLDL 25  0 - 40 mg/dL   LDL Cholesterol 956 (*) 0 - 99 mg/dL  GLUCOSE, CAPILLARY     Status: Abnormal   Collection Time   10/26/12  9:12 AM      Component Value Range   Glucose-Capillary 253 (*) 70 - 99 mg/dL    Ct Angio Head W/cm &/or Wo Cm  10/26/2012  *RADIOLOGY REPORT*  Clinical Data:   EVALUATE POSTERIOR FOSSA CIRCULATION FOR POSSIBLE DISSECTION OR STENOSIS.  HEADACHE AND DIZZINESS. DIABETIC HYPERTENSIVE PATIENT WITH HYPERLIPIDEMIA.  CT ANGIOGRAPHY HEAD AND NECK  Technique:  Multidetector CT imaging of the head and neck was performed using the standard protocol during bolus administration of intravenous contrast.  Multiplanar CT image reconstructions including MIPs were obtained to evaluate the vascular anatomy. Carotid stenosis measurements (when applicable) are obtained utilizing NASCET criteria, using the distal internal carotid diameter as the denominator.  Contrast: OMNIPAQUE  IOHEXOL 350 MG/ML SOLN  Comparison:  10/25/2012 MR brain, MR angiogram circle of Willis and MR angiogram of the neck.  CTA NECK  Findings:  Heterogeneous appearance of the thyroid gland with dominant right isthmus 9.2 x 4.4 mm nodule.  This can be followed with thyroid ultrasound.  Scattered increased number of normal to top normal size neck lymph nodes.  This includes a left level I 1.2 x 0.9 x 0.9 cm lymph node. Grouping of left level II lymph nodes with short axis dimension less than 1 cm.  Significance indeterminate.  Mild prominence of the palatine tonsils with minimal asymmetry. Direct visualization can be performed to exclude mucosal abnormality.  Visualized lung apices clear.  Mild cervical spondylotic changes.  Significant dental caries.  Common origin innominate artery left common carotid artery. Ectatic proximal great vessels.  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  Reconstitution of flow at the C1 level.  Artifact extends through the right subclavian artery.  Dominant right vertebral artery without significant stenosis.  Carotid bifurcation without hemodynamically significant stenosis. Tortuosity of the distal  vertical cervical segment of the internal carotid arteries bilaterally greater on the left without evidence of fibromuscular dysplasia.   Review of the MIP images confirms the above findings.  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  Reconstitution of flow at the C1 level.  No hemodynamically significant stenosis of the right vertebral artery or carotid bifurcation. Ectatic carotid arteries greater on the left.  Ectatic origin of the great vessels.  Increased number of normal to top normal sized lymph nodes as discussed above  of questionable significance.  Slight prominence palatine tonsils.  This may be normal for this patient.  Direct visualization would be necessary to exclude mucosal abnormality.  Heterogeneous appearance of the thyroid gland with dominant  right isthmus 9.2 x 4.4 mm nodule.  This can be followed with thyroid ultrasound.  Significant dental caries.  CTA HEAD  Findings:  In this patient with occluded cervical segment of the left vertebral artery which has reconstitution of flow at the C1 level, the portion of the left vertebral artery which is visualized is markedly diminutive in size.  Tiny irregular left PICA.  Remote left inferior cerebellar infarct.  Ectatic right vertebral artery and basilar artery.  Small slightly irregular right PICA.  Small left AICA.  Mild ectasia of the cavernous segment of the internal carotid artery without significant narrowing.  Small irregular A1 segment left anterior cerebral artery.  Dominant A2 segment right anterior cerebral artery approaching an azygos configuration.  No aneurysm noted.  Middle cerebral artery branch vessel irregularity.  No intracranial enhancing mass or findings of acute infarct.  Mild exophthalmos.   Review of the MIP images confirms the above findings.  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  The intracranial portion of the left vertebral artery which is visualized is markedly diminutive in size.  Tiny irregular left PICA.  Remote inferior left cerebellar infarct.  Please see above.  Preliminary report at the time imaging by Dr. Caryl Pina.   Original Report Authenticated By: Lacy Duverney, M.D.    Ct Head Wo Contrast  10/25/2012  *RADIOLOGY REPORT*  Clinical Data: Headache and dizziness.  CT HEAD WITHOUT CONTRAST  Technique:  Contiguous axial images were obtained from the base of the skull through the vertex without contrast.  Comparison: MRI performed earlier.  Findings: Remote left cerebellar infarct.  No acute stroke or bleed.  No mass lesions, hydrocephalus, or extra-axial fluid. Slight premature atrophy.  Chronic microvascular ischemic change. Calvarium intact.  Clear sinuses and mastoids.  IMPRESSION: Chronic left  cerebellar infarct.  No acute intracranial findings.   Original Report Authenticated By: Davonna Belling, M.D.    Ct Angio Neck W/cm &/or Wo/cm  10/26/2012  *RADIOLOGY REPORT*  Clinical Data:   EVALUATE POSTERIOR FOSSA CIRCULATION FOR POSSIBLE DISSECTION OR STENOSIS.  HEADACHE AND DIZZINESS. DIABETIC HYPERTENSIVE PATIENT WITH HYPERLIPIDEMIA.  CT ANGIOGRAPHY HEAD AND NECK  Technique:  Multidetector CT imaging of the head and neck was performed using the standard protocol during bolus administration of intravenous contrast.  Multiplanar CT image reconstructions including MIPs were obtained to evaluate the vascular anatomy. Carotid stenosis measurements (when applicable) are obtained utilizing NASCET criteria, using the distal internal carotid diameter as the denominator.  Contrast: OMNIPAQUE IOHEXOL 350 MG/ML SOLN  Comparison:  10/25/2012 MR brain, MR angiogram circle of Willis and MR angiogram of the neck.  CTA NECK  Findings:  Heterogeneous appearance of the thyroid gland with dominant right isthmus 9.2  x 4.4 mm nodule.  This can be followed with thyroid ultrasound.  Scattered increased number of normal to top normal size neck lymph nodes.  This includes a left level I 1.2 x 0.9 x 0.9 cm lymph node. Grouping of left level II lymph nodes with short axis dimension less than 1 cm.  Significance indeterminate.  Mild prominence of the palatine tonsils with minimal asymmetry. Direct visualization can be performed to exclude mucosal abnormality.  Visualized lung apices clear.  Mild cervical spondylotic changes.  Significant dental caries.  Common origin innominate artery left common carotid artery. Ectatic proximal great vessels.  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  Reconstitution of flow at the C1 level.  Artifact extends through the right subclavian artery.  Dominant right vertebral artery without significant stenosis.  Carotid bifurcation without hemodynamically significant stenosis. Tortuosity of  the distal vertical cervical segment of the internal carotid arteries bilaterally greater on the left without evidence of fibromuscular dysplasia.   Review of the MIP images confirms the above findings.  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  Reconstitution of flow at the C1 level.  No hemodynamically significant stenosis of the right vertebral artery or carotid bifurcation. Ectatic carotid arteries greater on the left.  Ectatic origin of the great vessels.  Increased number of normal to top normal sized lymph nodes as discussed above  of questionable significance.  Slight prominence palatine tonsils.  This may be normal for this patient.  Direct visualization would be necessary to exclude mucosal abnormality.  Heterogeneous appearance of the thyroid gland with dominant right isthmus 9.2 x 4.4 mm nodule.  This can be followed with thyroid ultrasound.  Significant dental caries.  CTA HEAD  Findings:  In this patient with occluded cervical segment of the left vertebral artery which has reconstitution of flow at the C1 level, the portion of the left vertebral artery which is visualized is markedly diminutive in size.  Tiny irregular left PICA.  Remote left inferior cerebellar infarct.  Ectatic right vertebral artery and basilar artery.  Small slightly irregular right PICA.  Small left AICA.  Mild ectasia of the cavernous segment of the internal carotid artery without significant narrowing.  Small irregular A1 segment left anterior cerebral artery.  Dominant A2 segment right anterior cerebral artery approaching an azygos configuration.  No aneurysm noted.  Middle cerebral artery branch vessel irregularity.  No intracranial enhancing mass or findings of acute infarct.  Mild exophthalmos.   Review of the MIP images confirms the above findings.  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  The intracranial portion of the left  vertebral artery which is visualized is markedly diminutive in size.  Tiny irregular left PICA.  Remote inferior left cerebellar infarct.  Please see above.  Preliminary report at the time imaging by Dr. Caryl Pina.   Original Report Authenticated By: Lacy Duverney, M.D.    Mr [facility] Wo Contrast  10/25/2012  *RADIOLOGY REPORT*  Clinical Data:    Dizziness, headache. History of hypertension. History of diabetes.  Noncompliant medications.  History of previous stroke.  MRI HEAD WITHOUT AND WITH CONTRAST MRA HEAD WITHOUT CONTRAST MRA NECK WITHOUT AND WITH CONTRAST  Technique: Multiplanar, multiecho pulse sequences of the brain and surrounding structures were obtained according to standard protocol without and with intravenous contrast.  Angiographic images of the Circle of Willis were obtained using MRA technique without intravenous contrast.  Angiographic images  of the neck were obtained using MRA technique without and with intravenous contrast.  Contrast: MultiHance 15 ml.  Comparison: CT head 03/09/2011.  MRI HEAD WITHOUT AND WITH CONTRAST  Findings:  Remote PICA territory inferior cerebellar infarct on the left.   No acute stroke or bleed.  Mild premature atrophy.  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 Post infusion, no abnormal intracranial enhancement.  The visualized calvarium and skull base are intact. Negative orbits, sinuses and mastoids.  No pituitary or cerebellar tonsillar abnormality.  Upper cervical region unremarkable. Compared with 2012, the chronic infarct was present at that time.  IMPRESSION: Remote left cerebellar infarct.  No evidence for acute infarction. Premature atrophy and chronic microvascular ischemic change.  MRA HEAD WITHOUT CONTRAST  Findings:  Patent internal carotid arteries but dolichoectatic consistent with history of hypertension.  Also dolichoectatic basilar  artery.  Right vertebral is the sole contributor to the basilar.  Left vertebral does not fill antegrade but appears to reconstitute over a 5 mm segment with slight retrograde flow. There is no proximal stenosis of the anterior, middle, there is no proximal stenosis of the middle cerebral arteries although mild nonstenotic irregularity can be seen bilaterally. Distal MCA branches show irregularity but are patent.  The right A1 ACA is the dominant contributor to the anterior cerebral arteries.  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  The proximal anterior cerebral artery distal to the anterior communicating appears azygos.  Mild nonstenotic irregularity can be seen in the ambient segment the right PCA.  Left PCA appears unremarkable.  No cerebellar branch occlusion except for left PICA which is not seen.  IMPRESSION: Intracranial atherosclerotic changes as described. 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    Left vertebral in the neck and skull base not patent with only a short segment reconstitution distally. Nonfilling left PICA.   MRA NECK WITHOUT AND WITH CONTRAST  Findings:  Bovine origin.  Dolichoectatic proximal vasculature without focal stenosis.  Nonstenotic Right carotid bifurcation.   Tortuous cervical ICA without dissection or fibromuscular change.  Nonstenotic left carotid bifurcation.  Moderately tortuous cervical ICA without dissection or muscular change.  Right vertebral widely patent throughout its course without ostial stenosis.  Left vertebral is small and  is occluded 2 cm from its origin.  There may be faint  reconstitution distally at the skull base, but there are multiple segmental areas of irregularity.  IMPRESSION: No extracranial carotid disease.  Right vertebral dominant and sole contributor to the basilar.  Severe disease of the left vertebral.  There is no continuous vessel from its origin to the intracranial  compartment.  Most of this vessel is occluded.   Original Report Authenticated By: Davonna Belling, M.D.    Mr Angiogram Neck W Wo Contrast  10/25/2012  *RADIOLOGY REPORT*  Clinical Data:    Dizziness, headache. History of hypertension. History of diabetes.  Noncompliant medications.  History of previous stroke.  MRI HEAD WITHOUT AND WITH CONTRAST MRA HEAD WITHOUT CONTRAST MRA NECK WITHOUT AND WITH CONTRAST  Technique: Multiplanar, multiecho pulse sequences of the brain and surrounding structures were obtained according to standard protocol without and with intravenous contrast.  Angiographic images of the Circle of Willis were obtained using MRA technique without intravenous contrast.  Angiographic images of the neck were obtained using MRA technique without and with intravenous contrast.  Contrast: MultiHance 15 ml.  Comparison: CT head 03/09/2011.  MRI HEAD WITHOUT AND WITH CONTRAST  Findings:  Remote PICA territory inferior cerebellar infarct on the left.   No acute stroke or bleed.  Mild premature atrophy.  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 Post infusion, no abnormal intracranial enhancement.  The visualized calvarium and skull base are intact. Negative orbits, sinuses and mastoids.  No pituitary or cerebellar tonsillar abnormality.  Upper cervical region unremarkable. Compared with 2012, the chronic infarct was present at that time.  IMPRESSION: Remote left cerebellar infarct.  No evidence for acute infarction. Premature atrophy and chronic microvascular ischemic change.  MRA HEAD WITHOUT CONTRAST  Findings:  Patent internal carotid arteries but dolichoectatic consistent with history of hypertension.  Also dolichoectatic basilar artery.  Right vertebral is the sole contributor to the basilar.  Left vertebral does not fill antegrade but appears to reconstitute over a 5 mm segment with  slight retrograde flow. There is no proximal stenosis of the anterior, middle, there is no proximal stenosis of the middle cerebral arteries although mild nonstenotic irregularity can be seen bilaterally. Distal MCA branches show irregularity but are patent.  The right A1 ACA is the dominant contributor to the anterior cerebral arteries.  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  The proximal anterior cerebral artery distal to the anterior communicating appears azygos.  Mild nonstenotic irregularity can be seen in the ambient segment the right PCA.  Left PCA appears unremarkable.  No cerebellar branch occlusion except for left PICA which is not seen.  IMPRESSION: Intracranial atherosclerotic changes as described. 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    Left vertebral in the neck and skull base not patent with only a short segment reconstitution distally. Nonfilling left PICA.   MRA NECK WITHOUT AND WITH CONTRAST  Findings:  Bovine origin.  Dolichoectatic proximal vasculature without focal stenosis.  Nonstenotic Right carotid bifurcation.   Tortuous cervical ICA without dissection or fibromuscular change.  Nonstenotic left carotid bifurcation.  Moderately tortuous cervical ICA without dissection or muscular change.  Right vertebral widely patent throughout its course without ostial stenosis.  Left vertebral is small and  is occluded 2 cm from its origin.  There may be faint  reconstitution distally at the skull base, but there are multiple segmental areas of irregularity.  IMPRESSION: No extracranial carotid disease.  Right vertebral dominant and sole contributor to the basilar.  Severe disease of the left vertebral.  There is no continuous vessel from its origin to the intracranial compartment.  Most of this vessel is occluded.   Original Report Authenticated By: Davonna Belling, M.D.    Mr [facility] Wo Contrast  10/25/2012  *RADIOLOGY REPORT*   Clinical Data:    Dizziness, headache. History of hypertension. History of diabetes.  Noncompliant medications.  History of previous stroke.  MRI HEAD WITHOUT AND WITH CONTRAST MRA HEAD WITHOUT CONTRAST MRA NECK WITHOUT AND WITH CONTRAST  Technique: Multiplanar, multiecho pulse sequences of the brain and surrounding structures were obtained according to standard protocol without and with intravenous contrast.  Angiographic images of the Circle of Willis were obtained using MRA technique without intravenous contrast.  Angiographic images of the neck were obtained using MRA technique without and with intravenous contrast.  Contrast: MultiHance 15 ml.  Comparison: CT head 03/09/2011.  MRI HEAD WITHOUT AND WITH CONTRAST  Findings:  Remote PICA territory inferior cerebellar  infarct on the left.   No acute stroke or bleed.  Mild premature atrophy.  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 Post infusion, no abnormal intracranial enhancement.  The visualized calvarium and skull base are intact. Negative orbits, sinuses and mastoids.  No pituitary or cerebellar tonsillar abnormality.  Upper cervical region unremarkable. Compared with 2012, the chronic infarct was present at that time.  IMPRESSION: Remote left cerebellar infarct.  No evidence for acute infarction. Premature atrophy and chronic microvascular ischemic change.  MRA HEAD WITHOUT CONTRAST  Findings:  Patent internal carotid arteries but dolichoectatic consistent with history of hypertension.  Also dolichoectatic basilar artery.  Right vertebral is the sole contributor to the basilar.  Left vertebral does not fill antegrade but appears to reconstitute over a 5 mm segment with slight retrograde flow. There is no proximal stenosis of the anterior, middle, there is no proximal stenosis of the middle cerebral arteries although mild nonstenotic  irregularity can be seen bilaterally. Distal MCA branches show irregularity but are patent.  The right A1 ACA is the dominant contributor to the anterior cerebral arteries.  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  The proximal anterior cerebral artery distal to the anterior communicating appears azygos.  Mild nonstenotic irregularity can be seen in the ambient segment the right PCA.  Left PCA appears unremarkable.  No cerebellar branch occlusion except for left PICA which is not seen.  IMPRESSION: Intracranial atherosclerotic changes as described. 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    Left vertebral in the neck and skull base not patent with only a short segment reconstitution distally. Nonfilling left PICA.   MRA NECK WITHOUT AND WITH CONTRAST  Findings:  Bovine origin.  Dolichoectatic proximal vasculature without focal stenosis.  Nonstenotic Right carotid bifurcation.   Tortuous cervical ICA without dissection or fibromuscular change.  Nonstenotic left carotid bifurcation.  Moderately tortuous cervical ICA without dissection or muscular change.  Right vertebral widely patent throughout its course without ostial stenosis.  Left vertebral is small and  is occluded 2 cm from its origin.  There may be faint  reconstitution distally at the skull base, but there are multiple segmental areas of irregularity.  IMPRESSION: No extracranial carotid disease.  Right vertebral dominant and sole contributor to the basilar.  Severe disease of the left vertebral.  There is no continuous vessel from its origin to the intracranial compartment.  Most of this vessel is occluded.   Original Report Authenticated By: Davonna Belling, M.D.     CT of the brain   IMPRESSION:  Remote left cerebellar infarct. No evidence for acute infarction.  Premature atrophy and chronic microvascular ischemic change.    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 Reconstitution  of flow at the C1 level.  No hemodynamically significant stenosis of the right vertebral  artery or carotid bifurcation. Ectatic carotid arteries greater on  the left.  Ectatic origin of the great vessels.  Increased number of normal to top normal sized lymph nodes as  discussed above of questionable significance.  Slight prominence palatine tonsils. This may be normal for this  patient. Direct visualization would be necessary to exclude  mucosal abnormality.  Heterogeneous appearance of the thyroid gland with dominant right  isthmus 9.2 x 4.4 mm nodule. This can be followed with thyroid  ultrasound.  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 The intracranial portion  of the left vertebral artery which is visualized is markedly  diminutive in size.  Tiny irregular left PICA. Remote inferior left cerebellar infarct.   MRI of the brain   IMPRESSION:  Remote left cerebellar infarct. No evidence for acute infarction.  Premature atrophy and chronic microvascular ischemic change   MRA of the brain   IMPRESSION:  Intracranial atherosclerotic changes as described. There is severe  disease left ACA, not clinically significant due to the dominant  contribution from the right. Left vertebral in the neck and  skull base not patent with only a short segment reconstitution  distally. Nonfilling left PICA.  IMPRESSION:  No extracranial carotid disease.  Right vertebral dominant and sole contributor to the basilar.  Severe disease of the left vertebral. There is no continuous  vessel from its origin to the intracranial compartment. Most of  this vessel is occluded.   2D Echocardiogram  Pending  Carotid Doppler  Not ordered  CXR  Not ordered  EKG   SINUS RHYTHM ~ normal P axis, V-rate 50- 99 FIRST DEGREE AV BLOCK ~ PR >210, V-rate 50- 90  Physical Exam    The patient is  alert and cooperative.  Neurologic exam reveals full extraocular movements, speech is normal. Visual fields are full.  Motor testing reveals good strength of all four extremities.  The patient has good finger-nose-finger and heel-to-shin bilaterally. Gait was not tested.  Deep tendon reflexes are symmetric and normal. Toes are down going bilaterally.    ASSESSMENT Ms. Salina Stanfield is a 51 y.o. female with a history of a left cerebellar infarct in 2010, and a history of migraine headache.   Stroke risk factors:  diabetes mellitus, hyperlipidemia and hypertension.  The patient indicates that she's been having headaches since age 53. The headaches usually are at least once a week. More recently, over the last 3 weeks, the patient has had daily headaches associated with some visual blurring and dizziness. The patient indicates that the headaches are global in nature. The patient denies any new numbness or weakness of the face, arms, or legs.  MRI of the head shows an old left cerebellar infarct with an occluded left vertebral artery. Is my suspicion that this occlusion occurred in 2010, and has nothing to do with her current clinical situation. The patient likely has had a converted migraine headache. The patient will be given 1000 mg of IV Depacon today. At this point, the stroke service will sign off, and the further care will be transferred to the neuro hospitalist service. 2-D echocardiogram is still pending. The patient was not on aspirin prior to admission, and she will get back on this upon discharge.  Hospital day # 1  TREATMENT/PLAN Continue aspirin 325 mg orally every day for secondary stroke prevention.  -2 D echo pending. -Stroke team to sign off.  Lesly Dukes

## 2012-10-26 NOTE — Progress Notes (Deleted)
   CARE MANAGEMENT NOTE 10/26/2012  Patient:  Brenda Ford   Account Number:  [id number]  Date Initiated:  10/26/2012  Documentation initiated by:  Brenda Ford  Subjective/Objective Assessment:   Cardiac Cath with Stent placement     Action/Plan:   Anticipated DC Date:  10/28/2012   Anticipated DC Plan:  HOME W HOME HEALTH SERVICES      DC Planning Services  CM consult      Brenda Ford  HOME HEALTH   Ford offered to / List presented to:  Brenda Ford Patient           Status of service:  In process, will continue to follow Medicare Important Message given?   (If response is "NO", the following Medicare IM given date fields will be blank) Date Medicare IM given:   Date Additional Medicare IM given:    Discharge Disposition:    Per UR Regulation:    If discussed at Long Length of Stay Meetings, dates discussed:    Comments:  10/26/2012 1600 NCM spoke to Ford and states he did not have difficulty with paying for his copay. States has a cream he uses on his face he has difficulty getting at times. Did not have name of cream. Will provide Ford with 30 day free trial of Brilinta. Will need separate Rx at time of d/c.  NCM will follow up with Ford's pharmacy for copay cost of medication or 11/18 will check with insurance. Will provided Ford with Brenda Ford agency list for possible d/c home with Brenda Ford. Will need order and F2F completed if Brenda Ford ordered at d/c Brenda Donning RN CCM Case Mgmt phone [phone]

## 2012-10-26 NOTE — Progress Notes (Signed)
[facility] PROGRESS NOTE  Brenda Ford XBJ:[id number] DOB: 05/17/1961 DOA: 10/25/2012 PCP: Sheila Oats, MD  Assessment/Plan: 1. 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 - Work up per neuro  2. Murmur cardiac - assess with echocardiogram - Patient denies any chest pain - troponin negative  3. Pain of left heel - X ray shows calcaneal spurs - patient will need follow up with pcp and or ortho as outpatient for further evaluation or recommendations.  4. DM - not well controlled and blood sugars ranged from 253-174 - place on diabetic diet - continue to monitor blood sugars and adjust medications pending results - hgba1c pending - continue SSI  5. HTN - not well controlled at this juncture - will continue to monitor at this juncture should blood pressures stay elevated would consider adding agent.   Code Status: full Family Communication: no family at bedside Disposition Plan: pending clinical improvement   Consultants:  Neurology  Procedures:  Echocardiogram  CT angiogram of head  MRI/ MRA of neck and head  Antibiotics:  None  HPI/Subjective: Still having some headaches and dizziness, denies any difficulty with speech or new focal neurological weaknesses.  No acute issues reported overnight.  Objective: Filed Vitals:   10/26/12 0650 10/26/12 0651 10/26/12 0652 10/26/12 0824  BP: 177/90 176/102 175/101 150/84  Pulse: 60 62 71 71  Temp:    98.3 F (36.8 C)  TempSrc:    Oral  Resp:    18  Height:      Weight:      SpO2:    97%    Intake/Output Summary (Last 24 hours) at 10/26/12 1244 Last data filed at 10/26/12 0826  Gross per 24 hour  Intake    240 ml  Output      0 ml  Net    240 ml   Filed Weights   10/26/12 0615    Weight: 83.28 kg (183 lb 9.6 oz)    Exam:   General:  Pt in NAD, Alert and Awake  Cardiovascular: RRR, systolic grade 2-3 murmur  Respiratory: CTA BL, no wheezes  Abdomen: soft, NT, ND  Data Reviewed: Basic Metabolic Panel:  Lab 10/25/12 3086  NA 137  K 3.8  CL 98  CO2 29  GLUCOSE 138*  BUN 11  CREATININE 0.89  CALCIUM 10.5  MG --  PHOS --   Liver Function Tests:  Lab 10/25/12 1834  AST 26  ALT 25  ALKPHOS 121*  BILITOT 0.5  PROT 9.0*  ALBUMIN 4.2   No results found for this basename: LIPASE:5,AMYLASE:5 in the last 168 hours No results found for this basename: AMMONIA:5 in the last 168 hours CBC:  Lab 10/25/12 1834  WBC 8.5  NEUTROABS 5.4  HGB 13.0  HCT 39.2  MCV 76.1*  PLT 325   Cardiac Enzymes:  Lab 10/25/12 2021  CKTOTAL --  CKMB --  CKMBINDEX --  TROPONINI <0.30   BNP (last 3 results) No results found for this basename: PROBNP:3 in the last 8760 hours CBG:  Lab 10/26/12 1151 10/26/12 0912  GLUCAP 174* 253*    No results found for this or any previous visit (from the past 240 hour(s)).   Studies: Ct Angio Head W/cm &/or Wo Cm  10/26/2012  *RADIOLOGY REPORT*  Clinical Data:   EVALUATE POSTERIOR FOSSA CIRCULATION FOR POSSIBLE DISSECTION OR STENOSIS.  HEADACHE AND DIZZINESS. DIABETIC HYPERTENSIVE PATIENT WITH HYPERLIPIDEMIA.  CT ANGIOGRAPHY HEAD AND NECK  Technique:  Multidetector CT imaging of the head and neck was performed using the standard protocol during bolus administration of intravenous contrast.  Multiplanar CT image reconstructions including MIPs were obtained to evaluate the vascular anatomy. Carotid stenosis measurements (when applicable) are obtained utilizing NASCET criteria, using the distal internal carotid diameter as the denominator.  Contrast: OMNIPAQUE IOHEXOL 350 MG/ML SOLN  Comparison:  10/25/2012 MR brain, MR angiogram circle of Willis and MR angiogram of the neck.  CTA NECK  Findings:  Heterogeneous appearance of  the thyroid gland with dominant right isthmus 9.2 x 4.4 mm nodule.  This can be followed with thyroid ultrasound.  Scattered increased number of normal to top normal size neck lymph nodes.  This includes a left level I 1.2 x 0.9 x 0.9 cm lymph node. Grouping of left level II lymph nodes with short axis dimension less than 1 cm.  Significance indeterminate.  Mild prominence of the palatine tonsils with minimal asymmetry. Direct visualization can be performed to exclude mucosal abnormality.  Visualized lung apices clear.  Mild cervical spondylotic changes.  Significant dental caries.  Common origin innominate artery left common carotid artery. Ectatic proximal great vessels.  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  Reconstitution of flow at the C1 level.  Artifact extends through the right subclavian artery.  Dominant right vertebral artery without significant stenosis.  Carotid bifurcation without hemodynamically significant stenosis. Tortuosity of the distal vertical cervical segment of the internal carotid arteries bilaterally greater on the left without evidence of fibromuscular dysplasia.   Review of the MIP images confirms the above findings.  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  Reconstitution of flow at the C1 level.  No hemodynamically significant stenosis of the right vertebral artery or carotid bifurcation. Ectatic carotid arteries greater on the left.  Ectatic origin of the great vessels.  Increased number of normal to top normal sized lymph nodes as discussed above  of questionable significance.  Slight prominence palatine tonsils.  This may be normal for this patient.  Direct visualization would be necessary to exclude mucosal abnormality.  Heterogeneous appearance of the thyroid gland with dominant right isthmus 9.2 x 4.4 mm nodule.  This can be followed with thyroid ultrasound.  Significant dental caries.  CTA HEAD  Findings:  In this patient with occluded  cervical segment of the left vertebral artery which has reconstitution of flow at the C1 level, the portion of the left vertebral artery which is visualized is markedly diminutive in size.  Tiny irregular left PICA.  Remote left inferior cerebellar infarct.  Ectatic right vertebral artery and basilar artery.  Small slightly irregular right PICA.  Small left AICA.  Mild ectasia of the cavernous segment of the internal carotid artery without significant narrowing.  Small irregular A1 segment left anterior cerebral artery.  Dominant A2 segment right anterior cerebral artery approaching an azygos configuration.  No aneurysm noted.  Middle cerebral artery branch vessel irregularity.  No intracranial enhancing mass or findings of acute infarct.  Mild exophthalmos.   Review of the MIP images confirms the above findings.  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  The intracranial portion of the left vertebral artery which is visualized is markedly diminutive in size.  Tiny  irregular left PICA.  Remote inferior left cerebellar infarct.  Please see above.  Preliminary report at the time imaging by Dr. Caryl Pina.   Original Report Authenticated By: Lacy Duverney, M.D.    Dg Os Calcis Left  10/26/2012  *RADIOLOGY REPORT*  Clinical Data: Left heel and plantar region pain.  LEFT OS CALCIS - 2+ VIEW  Comparison: None.  Findings: No fracture or bone lesion.  There are small plantar and dorsal calcaneal spurs.  The soft tissues are unremarkable.  IMPRESSION: Calcaneal spurs.  No other abnormality.   Original Report Authenticated By: Amie Portland, M.D.    Ct Head Wo Contrast  10/25/2012  *RADIOLOGY REPORT*  Clinical Data: Headache and dizziness.  CT HEAD WITHOUT CONTRAST  Technique:  Contiguous axial images were obtained from the base of the skull through the vertex without contrast.  Comparison: MRI performed earlier.  Findings: Remote left cerebellar  infarct.  No acute stroke or bleed.  No mass lesions, hydrocephalus, or extra-axial fluid. Slight premature atrophy.  Chronic microvascular ischemic change. Calvarium intact.  Clear sinuses and mastoids.  IMPRESSION: Chronic left cerebellar infarct.  No acute intracranial findings.   Original Report Authenticated By: Davonna Belling, M.D.    Ct Angio Neck W/cm &/or Wo/cm  10/26/2012  *RADIOLOGY REPORT*  Clinical Data:   EVALUATE POSTERIOR FOSSA CIRCULATION FOR POSSIBLE DISSECTION OR STENOSIS.  HEADACHE AND DIZZINESS. DIABETIC HYPERTENSIVE PATIENT WITH HYPERLIPIDEMIA.  CT ANGIOGRAPHY HEAD AND NECK  Technique:  Multidetector CT imaging of the head and neck was performed using the standard protocol during bolus administration of intravenous contrast.  Multiplanar CT image reconstructions including MIPs were obtained to evaluate the vascular anatomy. Carotid stenosis measurements (when applicable) are obtained utilizing NASCET criteria, using the distal internal carotid diameter as the denominator.  Contrast: OMNIPAQUE IOHEXOL 350 MG/ML SOLN  Comparison:  10/25/2012 MR brain, MR angiogram circle of Willis and MR angiogram of the neck.  CTA NECK  Findings:  Heterogeneous appearance of the thyroid gland with dominant right isthmus 9.2 x 4.4 mm nodule.  This can be followed with thyroid ultrasound.  Scattered increased number of normal to top normal size neck lymph nodes.  This includes a left level I 1.2 x 0.9 x 0.9 cm lymph node. Grouping of left level II lymph nodes with short axis dimension less than 1 cm.  Significance indeterminate.  Mild prominence of the palatine tonsils with minimal asymmetry. Direct visualization can be performed to exclude mucosal abnormality.  Visualized lung apices clear.  Mild cervical spondylotic changes.  Significant dental caries.  Common origin innominate artery left common carotid artery. Ectatic proximal great vessels.  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   Reconstitution of flow at the C1 level.  Artifact extends through the right subclavian artery.  Dominant right vertebral artery without significant stenosis.  Carotid bifurcation without hemodynamically significant stenosis. Tortuosity of the distal vertical cervical segment of the internal carotid arteries bilaterally greater on the left without evidence of fibromuscular dysplasia.   Review of the MIP images confirms the above findings.  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  Reconstitution of flow at the C1 level.  No hemodynamically significant stenosis of the right vertebral artery or carotid bifurcation. Ectatic carotid arteries greater on the left.  Ectatic origin of the great vessels.  Increased number of normal to top normal sized lymph nodes as discussed above  of questionable significance.  Slight prominence palatine tonsils.  This may be normal for  this patient.  Direct visualization would be necessary to exclude mucosal abnormality.  Heterogeneous appearance of the thyroid gland with dominant right isthmus 9.2 x 4.4 mm nodule.  This can be followed with thyroid ultrasound.  Significant dental caries.  CTA HEAD  Findings:  In this patient with occluded cervical segment of the left vertebral artery which has reconstitution of flow at the C1 level, the portion of the left vertebral artery which is visualized is markedly diminutive in size.  Tiny irregular left PICA.  Remote left inferior cerebellar infarct.  Ectatic right vertebral artery and basilar artery.  Small slightly irregular right PICA.  Small left AICA.  Mild ectasia of the cavernous segment of the internal carotid artery without significant narrowing.  Small irregular A1 segment left anterior cerebral artery.  Dominant A2 segment right anterior cerebral artery approaching an azygos configuration.  No aneurysm noted.  Middle cerebral artery branch vessel irregularity.  No intracranial enhancing mass or findings of acute  infarct.  Mild exophthalmos.   Review of the MIP images confirms the above findings.  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  The intracranial portion of the left vertebral artery which is visualized is markedly diminutive in size.  Tiny irregular left PICA.  Remote inferior left cerebellar infarct.  Please see above.  Preliminary report at the time imaging by Dr. Caryl Pina.   Original Report Authenticated By: Lacy Duverney, M.D.    Mr [facility] Wo Contrast  10/25/2012  *RADIOLOGY REPORT*  Clinical Data:    Dizziness, headache. History of hypertension. History of diabetes.  Noncompliant medications.  History of previous stroke.  MRI HEAD WITHOUT AND WITH CONTRAST MRA HEAD WITHOUT CONTRAST MRA NECK WITHOUT AND WITH CONTRAST  Technique: Multiplanar, multiecho pulse sequences of the brain and surrounding structures were obtained according to standard protocol without and with intravenous contrast.  Angiographic images of the Circle of Willis were obtained using MRA technique without intravenous contrast.  Angiographic images of the neck were obtained using MRA technique without and with intravenous contrast.  Contrast: MultiHance 15 ml.  Comparison: CT head 03/09/2011.  MRI HEAD WITHOUT AND WITH CONTRAST  Findings:  Remote PICA territory inferior cerebellar infarct on the left.   No acute stroke or bleed.  Mild premature atrophy.  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 Post infusion, no abnormal intracranial enhancement.  The visualized calvarium and skull base are intact. Negative orbits, sinuses and mastoids.  No pituitary or cerebellar tonsillar abnormality.  Upper cervical region unremarkable. Compared with 2012, the chronic infarct was present at that time.  IMPRESSION: Remote left cerebellar infarct.  No evidence for acute  infarction. Premature atrophy and chronic microvascular ischemic change.  MRA HEAD WITHOUT CONTRAST  Findings:  Patent internal carotid arteries but dolichoectatic consistent with history of hypertension.  Also dolichoectatic basilar artery.  Right vertebral is the sole contributor to the basilar.  Left vertebral does not fill antegrade but appears to reconstitute over a 5 mm segment with slight retrograde flow. There is no proximal stenosis of the anterior, middle, there is no proximal stenosis of the middle cerebral arteries although mild nonstenotic irregularity can be seen bilaterally. Distal MCA branches show irregularity but are patent.  The right A1 ACA is the dominant contributor to the anterior cerebral arteries.  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  The proximal anterior cerebral artery distal  to the anterior communicating appears azygos.  Mild nonstenotic irregularity can be seen in the ambient segment the right PCA.  Left PCA appears unremarkable.  No cerebellar branch occlusion except for left PICA which is not seen.  IMPRESSION: Intracranial atherosclerotic changes as described. 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    Left vertebral in the neck and skull base not patent with only a short segment reconstitution distally. Nonfilling left PICA.   MRA NECK WITHOUT AND WITH CONTRAST  Findings:  Bovine origin.  Dolichoectatic proximal vasculature without focal stenosis.  Nonstenotic Right carotid bifurcation.   Tortuous cervical ICA without dissection or fibromuscular change.  Nonstenotic left carotid bifurcation.  Moderately tortuous cervical ICA without dissection or muscular change.  Right vertebral widely patent throughout its course without ostial stenosis.  Left vertebral is small and  is occluded 2 cm from its origin.  There may be faint  reconstitution distally at the skull base, but there are multiple segmental areas of  irregularity.  IMPRESSION: No extracranial carotid disease.  Right vertebral dominant and sole contributor to the basilar.  Severe disease of the left vertebral.  There is no continuous vessel from its origin to the intracranial compartment.  Most of this vessel is occluded.   Original Report Authenticated By: Davonna Belling, M.D.    Mr Angiogram Neck W Wo Contrast  10/25/2012  *RADIOLOGY REPORT*  Clinical Data:    Dizziness, headache. History of hypertension. History of diabetes.  Noncompliant medications.  History of previous stroke.  MRI HEAD WITHOUT AND WITH CONTRAST MRA HEAD WITHOUT CONTRAST MRA NECK WITHOUT AND WITH CONTRAST  Technique: Multiplanar, multiecho pulse sequences of the brain and surrounding structures were obtained according to standard protocol without and with intravenous contrast.  Angiographic images of the Circle of Willis were obtained using MRA technique without intravenous contrast.  Angiographic images of the neck were obtained using MRA technique without and with intravenous contrast.  Contrast: MultiHance 15 ml.  Comparison: CT head 03/09/2011.  MRI HEAD WITHOUT AND WITH CONTRAST  Findings:  Remote PICA territory inferior cerebellar infarct on the left.   No acute stroke or bleed.  Mild premature atrophy.  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 Post infusion, no abnormal intracranial enhancement.  The visualized calvarium and skull base are intact. Negative orbits, sinuses and mastoids.  No pituitary or cerebellar tonsillar abnormality.  Upper cervical region unremarkable. Compared with 2012, the chronic infarct was present at that time.  IMPRESSION: Remote left cerebellar infarct.  No evidence for acute infarction. Premature atrophy and chronic microvascular ischemic change.  MRA HEAD WITHOUT CONTRAST  Findings:  Patent internal carotid arteries but dolichoectatic  consistent with history of hypertension.  Also dolichoectatic basilar artery.  Right vertebral is the sole contributor to the basilar.  Left vertebral does not fill antegrade but appears to reconstitute over a 5 mm segment with slight retrograde flow. There is no proximal stenosis of the anterior, middle, there is no proximal stenosis of the middle cerebral arteries although mild nonstenotic irregularity can be seen bilaterally. Distal MCA branches show irregularity but are patent.  The right A1 ACA is the dominant contributor to the anterior cerebral arteries.  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  The proximal anterior cerebral artery distal to the anterior communicating appears azygos.  Mild nonstenotic irregularity can be seen in the ambient segment the  right PCA.  Left PCA appears unremarkable.  No cerebellar branch occlusion except for left PICA which is not seen.  IMPRESSION: Intracranial atherosclerotic changes as described. 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    Left vertebral in the neck and skull base not patent with only a short segment reconstitution distally. Nonfilling left PICA.   MRA NECK WITHOUT AND WITH CONTRAST  Findings:  Bovine origin.  Dolichoectatic proximal vasculature without focal stenosis.  Nonstenotic Right carotid bifurcation.   Tortuous cervical ICA without dissection or fibromuscular change.  Nonstenotic left carotid bifurcation.  Moderately tortuous cervical ICA without dissection or muscular change.  Right vertebral widely patent throughout its course without ostial stenosis.  Left vertebral is small and  is occluded 2 cm from its origin.  There may be faint  reconstitution distally at the skull base, but there are multiple segmental areas of irregularity.  IMPRESSION: No extracranial carotid disease.  Right vertebral dominant and sole contributor to the basilar.  Severe disease of the left vertebral.  There  is no continuous vessel from its origin to the intracranial compartment.  Most of this vessel is occluded.   Original Report Authenticated By: Davonna Belling, M.D.    Mr Laqueta Jean Wo Contrast  10/25/2012  *RADIOLOGY REPORT*  Clinical Data:    Dizziness, headache. History of hypertension. History of diabetes.  Noncompliant medications.  History of previous stroke.  MRI HEAD WITHOUT AND WITH CONTRAST MRA HEAD WITHOUT CONTRAST MRA NECK WITHOUT AND WITH CONTRAST  Technique: Multiplanar, multiecho pulse sequences of the brain and surrounding structures were obtained according to standard protocol without and with intravenous contrast.  Angiographic images of the Circle of Willis were obtained using MRA technique without intravenous contrast.  Angiographic images of the neck were obtained using MRA technique without and with intravenous contrast.  Contrast: MultiHance 15 ml.  Comparison: CT head 03/09/2011.  MRI HEAD WITHOUT AND WITH CONTRAST  Findings:  Remote PICA territory inferior cerebellar infarct on the left.   No acute stroke or bleed.  Mild premature atrophy.  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 Post infusion, no abnormal intracranial enhancement.  The visualized calvarium and skull base are intact. Negative orbits, sinuses and mastoids.  No pituitary or cerebellar tonsillar abnormality.  Upper cervical region unremarkable. Compared with 2012, the chronic infarct was present at that time.  IMPRESSION: Remote left cerebellar infarct.  No evidence for acute infarction. Premature atrophy and chronic microvascular ischemic change.  MRA HEAD WITHOUT CONTRAST  Findings:  Patent internal carotid arteries but dolichoectatic consistent with history of hypertension.  Also dolichoectatic basilar artery.  Right vertebral is the sole contributor to the basilar.  Left vertebral does not fill antegrade but  appears to reconstitute over a 5 mm segment with slight retrograde flow. There is no proximal stenosis of the anterior, middle, there is no proximal stenosis of the middle cerebral arteries although mild nonstenotic irregularity can be seen bilaterally. Distal MCA branches show irregularity but are patent.  The right A1 ACA is the dominant contributor to the anterior cerebral arteries.  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  The proximal anterior cerebral artery distal to the anterior communicating appears azygos.  Mild nonstenotic irregularity can be seen in the ambient segment the right PCA.  Left PCA appears unremarkable.  No cerebellar branch occlusion except for left PICA which is not  seen.  IMPRESSION: Intracranial atherosclerotic changes as described. 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    Left vertebral in the neck and skull base not patent with only a short segment reconstitution distally. Nonfilling left PICA.   MRA NECK WITHOUT AND WITH CONTRAST  Findings:  Bovine origin.  Dolichoectatic proximal vasculature without focal stenosis.  Nonstenotic Right carotid bifurcation.   Tortuous cervical ICA without dissection or fibromuscular change.  Nonstenotic left carotid bifurcation.  Moderately tortuous cervical ICA without dissection or muscular change.  Right vertebral widely patent throughout its course without ostial stenosis.  Left vertebral is small and  is occluded 2 cm from its origin.  There may be faint  reconstitution distally at the skull base, but there are multiple segmental areas of irregularity.  IMPRESSION: No extracranial carotid disease.  Right vertebral dominant and sole contributor to the basilar.  Severe disease of the left vertebral.  There is no continuous vessel from its origin to the intracranial compartment.  Most of this vessel is occluded.   Original Report Authenticated By: Davonna Belling, M.D.     Scheduled  Meds:   . aspirin EC  325 mg Oral Daily  . enoxaparin (LOVENOX) injection  40 mg Subcutaneous Q24H  . influenza  inactive virus vaccine  0.5 mL Intramuscular Tomorrow-1000  . insulin aspart  0-5 Units Subcutaneous QHS  . insulin aspart  0-9 Units Subcutaneous TID WC  . [COMPLETED] ketorolac  30 mg Intravenous Once  . [COMPLETED] LORazepam  1 mg Intravenous Once  . [COMPLETED] meclizine  25 mg Oral Once  . [COMPLETED]  morphine injection  4 mg Intravenous Once  . [COMPLETED] ondansetron (ZOFRAN) IV  4 mg Intravenous Once  . pneumococcal 23 valent vaccine  0.5 mL Intramuscular Tomorrow-1000  . [COMPLETED] sodium chloride  1,000 mL Intravenous Once  . sodium chloride  3 mL Intravenous Q12H  . sodium chloride  3 mL Intravenous Q12H  . valproate sodium  1,000 mg Intravenous Once   Continuous Infusions:   Principal Problem:  *Vertigo Active Problems:  Cerebrovascular disease  Hypertension  Diabetes mellitus  Hyperlipidemia  Pain of left heel    Time spent: > 35 minutes    Penny Pia  [facility] Pager [phone]. If 8PM-8AM, please contact night-coverage at [email or web address], password [facility] 10/26/2012, 12:44 PM  LOS: 1 day

## 2012-10-26 NOTE — H&P (Signed)
[facility] History and Physical  Manha Velten OZH:[id number] DOB: [birth year] DOA: 10/25/2012  Referring physician: EDP PCP: Sheila Oats, MD  Specialists: Neurology:  Dr. Amada Jupiter  Chief Complaint: Dizziness X 3 weeks  HPI: Brenda Ford is a 51 y.o. female with Multiple medical problems including a previous CVA, who presents to the ED with complaints of severe dizziness and lightheadedness X 3 weeks.  She reports having a dull headache as well.  She denies any fever chills or nausea, chest pain or syncope.  She does report feeling as if she would pass out.  In the ED she was evaluated and neurology was consulted and saw her in the ED.  An MRI of the Brain was performed to evaluate for a posterior infarct.    She also has complaints of left heel pain X 3 weeks and she denies any history of trauma, and has no prior history of gout.      Review of Systems: The patient denies anorexia, fever, weight loss,, vision loss, decreased hearing, hoarseness, chest pain, syncope, dyspnea on exertion, peripheral edema, balance deficits, hemoptysis, abdominal pain, melena, hematochezia, severe indigestion/heartburn, hematuria, incontinence, genital sores, muscle weakness, suspicious skin lesions, transient blindness, difficulty walking, depression, unusual weight change, abnormal bleeding, enlarged lymph nodes, angioedema, and breast masses.    Past Medical History  Diagnosis Date  . Stroke   . Diabetes mellitus   . Hypertension   . Hyperlipidemia   . Migraine headache   . Obesity    Past Surgical History  Procedure Date  . Pilonidal cyst excision   . Cyst excision     Left Breast    Medications:  HOME MEDS: Prior to Admission medications   Medication Sig Start Date End Date Taking? Authorizing Provider  ibuprofen (ADVIL,MOTRIN) 200 MG tablet Take 400 mg by mouth every 6 (six) hours as needed. For pain   Yes Historical Provider, MD     No Known Allergies   Social  History:  reports that she has never smoked. She does not have any smokeless tobacco history on file. She reports that she does not drink alcohol. Her drug history not on file.   Family History  Problem Relation Age of Onset  . Coronary artery disease Mother   . Diabetes type II Mother   . Hypertension Mother   . Cancer - Lung Father   . Cancer - Other Sister     Lymphoma      Physical Exam:  GEN:  Pleasant Morbidly Obese 51 year African American Female examined  and in no acute distress; cooperative with exam Filed Vitals:   10/26/12 0200 10/26/12 0300 10/26/12 0427 10/26/12 0545  BP: 147/85 151/93  157/93  Pulse: 65 64  72  Temp:   98.2 F (36.8 C)   TempSrc:      Resp: 14 22  12   SpO2: 96% 99%  98%   Blood pressure 157/93, pulse 72, temperature 98.2 F (36.8 C), temperature source Oral, resp. rate 12, SpO2 98.00%. PSYCH: She is alert and oriented x4; does not appear anxious does not appear depressed; affect is normal HEENT: Normocephalic and Atraumatic, Mucous membranes pink; PERRLA; EOM intact; Fundi:  Benign;  No scleral icterus, Nares: Patent, Oropharynx: Clear, Sparse Poor Dentition, Neck:  FROM, no cervical lymphadenopathy nor thyromegaly or carotid bruit; no JVD; Breasts:: Not examined CHEST WALL: No tenderness CHEST: Normal respiration, clear to auscultation bilaterally HEART: Regular rate and rhythm; no murmurs rubs or gallops BACK: No kyphosis or scoliosis; no CVA  tenderness ABDOMEN: Positive Bowel Sounds, Obese, soft non-tender; no masses, no organomegaly. Rectal Exam: Not done EXTREMITIES: No bone or joint deformity; age-appropriate arthropathy of the hands and knees; no cyanosis, clubbing or edema; no ulcerations. Genitalia: not examined PULSES: 2+ and symmetric SKIN: Normal hydration no rash or ulceration CNS: Cranial nerves 2-12 grossly intact no focal neurologic deficit    Labs on Admission:  Basic Metabolic Panel:  Lab 10/25/12 4098  NA 137  K 3.8    CL 98  CO2 29  GLUCOSE 138*  BUN 11  CREATININE 0.89  CALCIUM 10.5  MG --  PHOS --   Liver Function Tests:  Lab 10/25/12 1834  AST 26  ALT 25  ALKPHOS 121*  BILITOT 0.5  PROT 9.0*  ALBUMIN 4.2   No results found for this basename: LIPASE:5,AMYLASE:5 in the last 168 hours No results found for this basename: AMMONIA:5 in the last 168 hours CBC:  Lab 10/25/12 1834  WBC 8.5  NEUTROABS 5.4  HGB 13.0  HCT 39.2  MCV 76.1*  PLT 325   Cardiac Enzymes:  Lab 10/25/12 2021  CKTOTAL --  CKMB --  CKMBINDEX --  TROPONINI <0.30    BNP (last 3 results) No results found for this basename: PROBNP:3 in the last 8760 hours CBG: No results found for this basename: GLUCAP:5 in the last 168 hours  Radiological Exams on Admission: Ct Head Wo Contrast  10/25/2012  *RADIOLOGY REPORT*  Clinical Data: Headache and dizziness.  CT HEAD WITHOUT CONTRAST  Technique:  Contiguous axial images were obtained from the base of the skull through the vertex without contrast.  Comparison: MRI performed earlier.  Findings: Remote left cerebellar infarct.  No acute stroke or bleed.  No mass lesions, hydrocephalus, or extra-axial fluid. Slight premature atrophy.  Chronic microvascular ischemic change. Calvarium intact.  Clear sinuses and mastoids.  IMPRESSION: Chronic left cerebellar infarct.  No acute intracranial findings.   Original Report Authenticated By: Davonna Belling, M.D.    Mr [facility] Wo Contrast  10/25/2012  *RADIOLOGY REPORT*  Clinical Data:    Dizziness, headache. History of hypertension. History of diabetes.  Noncompliant medications.  History of previous stroke.  MRI HEAD WITHOUT AND WITH CONTRAST MRA HEAD WITHOUT CONTRAST MRA NECK WITHOUT AND WITH CONTRAST  Technique: Multiplanar, multiecho pulse sequences of the brain and surrounding structures were obtained according to standard protocol without and with intravenous contrast.  Angiographic images of the Circle of Willis were obtained using  MRA technique without intravenous contrast.  Angiographic images of the neck were obtained using MRA technique without and with intravenous contrast.  Contrast: MultiHance 15 ml.  Comparison: CT head 03/09/2011.  MRI HEAD WITHOUT AND WITH CONTRAST  Findings:  Remote PICA territory inferior cerebellar infarct on the left.   No acute stroke or bleed.  Mild premature atrophy.  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 Post infusion, no abnormal intracranial enhancement.  The visualized calvarium and skull base are intact. Negative orbits, sinuses and mastoids.  No pituitary or cerebellar tonsillar abnormality.  Upper cervical region unremarkable. Compared with 2012, the chronic infarct was present at that time.  IMPRESSION: Remote left cerebellar infarct.  No evidence for acute infarction. Premature atrophy and chronic microvascular ischemic change.  MRA HEAD WITHOUT CONTRAST  Findings:  Patent internal carotid arteries but dolichoectatic consistent with history of hypertension.  Also dolichoectatic basilar artery.  Right vertebral is the sole contributor to  the basilar.  Left vertebral does not fill antegrade but appears to reconstitute over a 5 mm segment with slight retrograde flow. There is no proximal stenosis of the anterior, middle, there is no proximal stenosis of the middle cerebral arteries although mild nonstenotic irregularity can be seen bilaterally. Distal MCA branches show irregularity but are patent.  The right A1 ACA is the dominant contributor to the anterior cerebral arteries.  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  The proximal anterior cerebral artery distal to the anterior communicating appears azygos.  Mild nonstenotic irregularity can be seen in the ambient segment the right PCA.  Left PCA appears unremarkable.  No cerebellar branch occlusion except for left PICA which is not seen.   IMPRESSION: Intracranial atherosclerotic changes as described. 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    Left vertebral in the neck and skull base not patent with only a short segment reconstitution distally. Nonfilling left PICA.   MRA NECK WITHOUT AND WITH CONTRAST  Findings:  Bovine origin.  Dolichoectatic proximal vasculature without focal stenosis.  Nonstenotic Right carotid bifurcation.   Tortuous cervical ICA without dissection or fibromuscular change.  Nonstenotic left carotid bifurcation.  Moderately tortuous cervical ICA without dissection or muscular change.  Right vertebral widely patent throughout its course without ostial stenosis.  Left vertebral is small and  is occluded 2 cm from its origin.  There may be faint  reconstitution distally at the skull base, but there are multiple segmental areas of irregularity.  IMPRESSION: No extracranial carotid disease.  Right vertebral dominant and sole contributor to the basilar.  Severe disease of the left vertebral.  There is no continuous vessel from its origin to the intracranial compartment.  Most of this vessel is occluded.   Original Report Authenticated By: Davonna Belling, M.D.    Mr Angiogram Neck W Wo Contrast  10/25/2012  *RADIOLOGY REPORT*  Clinical Data:    Dizziness, headache. History of hypertension. History of diabetes.  Noncompliant medications.  History of previous stroke.  MRI HEAD WITHOUT AND WITH CONTRAST MRA HEAD WITHOUT CONTRAST MRA NECK WITHOUT AND WITH CONTRAST  Technique: Multiplanar, multiecho pulse sequences of the brain and surrounding structures were obtained according to standard protocol without and with intravenous contrast.  Angiographic images of the Circle of Willis were obtained using MRA technique without intravenous contrast.  Angiographic images of the neck were obtained using MRA technique without and with intravenous contrast.  Contrast: MultiHance  15 ml.  Comparison: CT head 03/09/2011.  MRI HEAD WITHOUT AND WITH CONTRAST  Findings:  Remote PICA territory inferior cerebellar infarct on the left.   No acute stroke or bleed.  Mild premature atrophy.  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 Post infusion, no abnormal intracranial enhancement.  The visualized calvarium and skull base are intact. Negative orbits, sinuses and mastoids.  No pituitary or cerebellar tonsillar abnormality.  Upper cervical region unremarkable. Compared with 2012, the chronic infarct was present at that time.  IMPRESSION: Remote left cerebellar infarct.  No evidence for acute infarction. Premature atrophy and chronic microvascular ischemic change.  MRA HEAD WITHOUT CONTRAST  Findings:  Patent internal carotid arteries but dolichoectatic consistent with history of hypertension.  Also dolichoectatic basilar artery.  Right vertebral is the sole contributor to the basilar.  Left vertebral does not fill antegrade but appears to reconstitute over a 5 mm segment  with slight retrograde flow. There is no proximal stenosis of the anterior, middle, there is no proximal stenosis of the middle cerebral arteries although mild nonstenotic irregularity can be seen bilaterally. Distal MCA branches show irregularity but are patent.  The right A1 ACA is the dominant contributor to the anterior cerebral arteries.  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  The proximal anterior cerebral artery distal to the anterior communicating appears azygos.  Mild nonstenotic irregularity can be seen in the ambient segment the right PCA.  Left PCA appears unremarkable.  No cerebellar branch occlusion except for left PICA which is not seen.  IMPRESSION: Intracranial atherosclerotic changes as described. There is severe disease left ACA, not clinically significant due to the dominant contribution from the  right.    Left vertebral in the neck and skull base not patent with only a short segment reconstitution distally. Nonfilling left PICA.   MRA NECK WITHOUT AND WITH CONTRAST  Findings:  Bovine origin.  Dolichoectatic proximal vasculature without focal stenosis.  Nonstenotic Right carotid bifurcation.   Tortuous cervical ICA without dissection or fibromuscular change.  Nonstenotic left carotid bifurcation.  Moderately tortuous cervical ICA without dissection or muscular change.  Right vertebral widely patent throughout its course without ostial stenosis.  Left vertebral is small and  is occluded 2 cm from its origin.  There may be faint  reconstitution distally at the skull base, but there are multiple segmental areas of irregularity.  IMPRESSION: No extracranial carotid disease.  Right vertebral dominant and sole contributor to the basilar.  Severe disease of the left vertebral.  There is no continuous vessel from its origin to the intracranial compartment.  Most of this vessel is occluded.   Original Report Authenticated By: Davonna Belling, M.D.    Mr [facility] Wo Contrast  10/25/2012  *RADIOLOGY REPORT*  Clinical Data:    Dizziness, headache. History of hypertension. History of diabetes.  Noncompliant medications.  History of previous stroke.  MRI HEAD WITHOUT AND WITH CONTRAST MRA HEAD WITHOUT CONTRAST MRA NECK WITHOUT AND WITH CONTRAST  Technique: Multiplanar, multiecho pulse sequences of the brain and surrounding structures were obtained according to standard protocol without and with intravenous contrast.  Angiographic images of the Circle of Willis were obtained using MRA technique without intravenous contrast.  Angiographic images of the neck were obtained using MRA technique without and with intravenous contrast.  Contrast: MultiHance 15 ml.  Comparison: CT head 03/09/2011.  MRI HEAD WITHOUT AND WITH CONTRAST  Findings:  Remote PICA territory inferior cerebellar infarct on the left.   No acute stroke or  bleed.  Mild premature atrophy.  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 Post infusion, no abnormal intracranial enhancement.  The visualized calvarium and skull base are intact. Negative orbits, sinuses and mastoids.  No pituitary or cerebellar tonsillar abnormality.  Upper cervical region unremarkable. Compared with 2012, the chronic infarct was present at that time.  IMPRESSION: Remote left cerebellar infarct.  No evidence for acute infarction. Premature atrophy and chronic microvascular ischemic change.  MRA HEAD WITHOUT CONTRAST  Findings:  Patent internal carotid arteries but dolichoectatic consistent with history of hypertension.  Also dolichoectatic basilar artery.  Right vertebral is the sole contributor to the basilar.  Left vertebral does not fill antegrade but appears to reconstitute over a 5 mm segment with slight retrograde flow. There is no proximal stenosis of the anterior, middle, there is no proximal stenosis of  the middle cerebral arteries although mild nonstenotic irregularity can be seen bilaterally. Distal MCA branches show irregularity but are patent.  The right A1 ACA is the dominant contributor to the anterior cerebral arteries.  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  The proximal anterior cerebral artery distal to the anterior communicating appears azygos.  Mild nonstenotic irregularity can be seen in the ambient segment the right PCA.  Left PCA appears unremarkable.  No cerebellar branch occlusion except for left PICA which is not seen.  IMPRESSION: Intracranial atherosclerotic changes as described. 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    Left vertebral in the neck and skull base not patent with only a short segment reconstitution distally. Nonfilling left PICA.   MRA NECK WITHOUT AND WITH CONTRAST   Findings:  Bovine origin.  Dolichoectatic proximal vasculature without focal stenosis.  Nonstenotic Right carotid bifurcation.   Tortuous cervical ICA without dissection or fibromuscular change.  Nonstenotic left carotid bifurcation.  Moderately tortuous cervical ICA without dissection or muscular change.  Right vertebral widely patent throughout its course without ostial stenosis.  Left vertebral is small and  is occluded 2 cm from its origin.  There may be faint  reconstitution distally at the skull base, but there are multiple segmental areas of irregularity.  IMPRESSION: No extracranial carotid disease.  Right vertebral dominant and sole contributor to the basilar.  Severe disease of the left vertebral.  There is no continuous vessel from its origin to the intracranial compartment.  Most of this vessel is occluded.   Original Report Authenticated By: Davonna Belling, M.D.     EKG: Independently reviewed.   Assessment Principal Problem:  *Vertigo Active Problems:  Cerebrovascular disease  Hypertension  Diabetes mellitus  Hyperlipidemia  Pain of left heel Occluded Left Vertebral Artery   Plan:         Admit to Telemetry Bed Seen by Neurology in ED, Dr. Amada Jupiter Neurovascular checks Orthostatic Vitals q shift X 4 Check Fasting Lipids Check Uric Acid level and order X-ray of left heel Reconcile Home Medications DVT prophylaxis    Code Status:  FULL CODE Family Communication:  N/A Disposition Plan:  Return to Home  Time spent: 68 Minutes   Ron Parker [facility] Pager [phone]  If 7PM-7AM, please contact night-coverage [email or web address] Password [email or web address] 10/26/2012, 6:11 AM

## 2012-10-26 NOTE — ED Provider Notes (Signed)
Medical screening examination/treatment/procedure(s) were conducted as a shared visit with non-physician practitioner(s) and myself.  I personally evaluated the patient during the encounter  Glynn Octave, MD 10/26/12 1142

## 2012-10-27 LAB — BASIC METABOLIC PANEL
Calcium: 9.7 mg/dL (ref 8.4–10.5)
GFR calc Af Amer: 86 mL/min — ABNORMAL LOW (ref >90)
GFR calc non Af Amer: 74 mL/min — ABNORMAL LOW (ref >90)
Glucose, Bld: 149 mg/dL — ABNORMAL HIGH (ref 70–99)
Sodium: 135 mEq/L (ref 135–145)

## 2012-10-27 LAB — CBC
MCH: 24.5 pg — ABNORMAL LOW (ref 26.0–34.0)
Platelets: 283 10*3/uL (ref 150–400)
RBC: 4.73 MIL/uL (ref 3.87–5.11)
WBC: 7.6 10*3/uL (ref 4.0–10.5)

## 2012-10-27 LAB — GLUCOSE, CAPILLARY: Glucose-Capillary: 170 mg/dL — ABNORMAL HIGH (ref 70–99)

## 2012-10-27 MED ORDER — METOPROLOL SUCCINATE 12.5 MG HALF TABLET
12.5000 mg | ORAL_TABLET | Freq: Every day | ORAL | Status: DC
  Administered 2012-10-27 – 2012-10-30 (×4): 12.5 mg via ORAL
  Filled 2012-10-27 (×4): qty 1

## 2012-10-27 NOTE — Progress Notes (Signed)
*  PRELIMINARY RESULTS* Vascular Ultrasound Left lower extremity venous duplex has been completed.  Preliminary findings: Left:  No evidence of DVT, superficial thrombosis, or Baker's cyst.   Farrel Demark, RDMS, RVT 10/26/12

## 2012-10-27 NOTE — Progress Notes (Addendum)
Subjective: Headache severity has lessened. Maximum headache intensity today his been 7/10. Dizziness has resolved. Echocardiogram is pending.  Objective: Current vital signs: BP 124/77  Pulse 72  Temp 98.3 F (36.8 C) (Oral)  Resp 16  Ht 5\' 1"  (1.549 m)  Wt 83.28 kg (183 lb 9.6 oz)  BMI 34.69 kg/m2  SpO2 96%  LMP 02/11/2011  Neurologic Exam: Alert and in no acute distress. Mental status was normal. Cranial nerves were normal. Movement of extremities was equal. Coordination was normal.   Lab Results: Results for orders placed during the hospital encounter of 10/25/12 (from the past 48 hour(s))  URINALYSIS, ROUTINE W REFLEX MICROSCOPIC     Status: Abnormal   Collection Time   10/25/12  6:08 PM      Component Value Range Comment   Color, Urine YELLOW  YELLOW    APPearance CLOUDY (*) CLEAR    Specific Gravity, Urine 1.024  1.005 - 1.030    pH 6.0  5.0 - 8.0    Glucose, UA NEGATIVE  NEGATIVE mg/dL    Hgb urine dipstick NEGATIVE  NEGATIVE    Bilirubin Urine NEGATIVE  NEGATIVE    Ketones, ur NEGATIVE  NEGATIVE mg/dL    Protein, ur NEGATIVE  NEGATIVE mg/dL    Urobilinogen, UA 0.2  0.0 - 1.0 mg/dL    Nitrite NEGATIVE  NEGATIVE    Leukocytes, UA SMALL (*) NEGATIVE   URINE MICROSCOPIC-ADD ON     Status: Abnormal   Collection Time   10/25/12  6:08 PM      Component Value Range Comment   Squamous Epithelial / LPF MANY (*) RARE    WBC, UA 0-2  <3 WBC/hpf    RBC / HPF 0-2  <3 RBC/hpf    Bacteria, UA FEW (*) RARE    Crystals CA OXALATE CRYSTALS (*) NEGATIVE    Urine-Other MUCOUS PRESENT     CBC WITH DIFFERENTIAL     Status: Abnormal   Collection Time   10/25/12  6:34 PM      Component Value Range Comment   WBC 8.5  4.0 - 10.5 K/uL    RBC 5.15 (*) 3.87 - 5.11 MIL/uL    Hemoglobin 13.0  12.0 - 15.0 g/dL    HCT 11.9  14.7 - 82.9 %    MCV 76.1 (*) 78.0 - 100.0 fL    MCH 25.2 (*) 26.0 - 34.0 pg    MCHC 33.2  30.0 - 36.0 g/dL    RDW 56.2  13.0 - 86.5 %    Platelets 325  150 -  400 K/uL    Neutrophils Relative 64  43 - 77 %    Neutro Abs 5.4  1.7 - 7.7 K/uL    Lymphocytes Relative 30  12 - 46 %    Lymphs Abs 2.5  0.7 - 4.0 K/uL    Monocytes Relative 6  3 - 12 %    Monocytes Absolute 0.5  0.1 - 1.0 K/uL    Eosinophils Relative 1  0 - 5 %    Eosinophils Absolute 0.1  0.0 - 0.7 K/uL    Basophils Relative 0  0 - 1 %    Basophils Absolute 0.0  0.0 - 0.1 K/uL   COMPREHENSIVE METABOLIC PANEL     Status: Abnormal   Collection Time   10/25/12  6:34 PM      Component Value Range Comment   Sodium 137  135 - 145 mEq/L    Potassium 3.8  3.5 -  5.1 mEq/L    Chloride 98  96 - 112 mEq/L    CO2 29  19 - 32 mEq/L    Glucose, Bld 138 (*) 70 - 99 mg/dL    BUN 11  6 - 23 mg/dL    Creatinine, Ser 1.61  0.50 - 1.10 mg/dL    Calcium 09.6  8.4 - 10.5 mg/dL    Total Protein 9.0 (*) 6.0 - 8.3 g/dL    Albumin 4.2  3.5 - 5.2 g/dL    AST 26  0 - 37 U/L    ALT 25  0 - 35 U/L    Alkaline Phosphatase 121 (*) 39 - 117 U/L    Total Bilirubin 0.5  0.3 - 1.2 mg/dL    GFR calc non Af Amer 74 (*) >90 mL/min    GFR calc Af Amer 86 (*) >90 mL/min   TROPONIN I     Status: Normal   Collection Time   10/25/12  8:21 PM      Component Value Range Comment   Troponin I <0.30  <0.30 ng/mL   HEMOGLOBIN A1C     Status: Abnormal   Collection Time   10/26/12  8:19 AM      Component Value Range Comment   Hemoglobin A1C 8.6 (*) <5.7 %    Mean Plasma Glucose 200 (*) <117 mg/dL   URIC ACID     Status: Normal   Collection Time   10/26/12  8:19 AM      Component Value Range Comment   Uric Acid, Serum 5.1  2.4 - 7.0 mg/dL   LIPID PANEL     Status: Abnormal   Collection Time   10/26/12  8:26 AM      Component Value Range Comment   Cholesterol 199  0 - 200 mg/dL    Triglycerides 045  <409 mg/dL    HDL 69  >81 mg/dL    Total CHOL/HDL Ratio 2.9      VLDL 25  0 - 40 mg/dL    LDL Cholesterol 191 (*) 0 - 99 mg/dL   GLUCOSE, CAPILLARY     Status: Abnormal   Collection Time   10/26/12  9:12 AM       Component Value Range Comment   Glucose-Capillary 253 (*) 70 - 99 mg/dL   GLUCOSE, CAPILLARY     Status: Abnormal   Collection Time   10/26/12 11:51 AM      Component Value Range Comment   Glucose-Capillary 174 (*) 70 - 99 mg/dL    Comment 1 Notify RN     GLUCOSE, CAPILLARY     Status: Abnormal   Collection Time   10/26/12  4:48 PM      Component Value Range Comment   Glucose-Capillary 167 (*) 70 - 99 mg/dL    Comment 1 Notify RN     GLUCOSE, CAPILLARY     Status: Abnormal   Collection Time   10/26/12  8:22 PM      Component Value Range Comment   Glucose-Capillary 126 (*) 70 - 99 mg/dL    Comment 1 Notify RN     BASIC METABOLIC PANEL     Status: Abnormal   Collection Time   10/27/12  5:55 AM      Component Value Range Comment   Sodium 135  135 - 145 mEq/L    Potassium 3.6  3.5 - 5.1 mEq/L    Chloride 97  96 - 112 mEq/L    CO2  27  19 - 32 mEq/L    Glucose, Bld 149 (*) 70 - 99 mg/dL    BUN 16  6 - 23 mg/dL    Creatinine, Ser 1.61  0.50 - 1.10 mg/dL    Calcium 9.7  8.4 - 09.6 mg/dL    GFR calc non Af Amer 74 (*) >90 mL/min    GFR calc Af Amer 86 (*) >90 mL/min   CBC     Status: Abnormal   Collection Time   10/27/12  5:55 AM      Component Value Range Comment   WBC 7.6  4.0 - 10.5 K/uL    RBC 4.73  3.87 - 5.11 MIL/uL    Hemoglobin 11.6 (*) 12.0 - 15.0 g/dL    HCT 04.5 (*) 40.9 - 46.0 %    MCV 75.9 (*) 78.0 - 100.0 fL    MCH 24.5 (*) 26.0 - 34.0 pg    MCHC 32.3  30.0 - 36.0 g/dL    RDW 81.1  91.4 - 78.2 %    Platelets 283  150 - 400 K/uL   GLUCOSE, CAPILLARY     Status: Abnormal   Collection Time   10/27/12  7:26 AM      Component Value Range Comment   Glucose-Capillary 168 (*) 70 - 99 mg/dL    Comment 1 Notify RN       Studies/Results: Ct Angio Head W/cm &/or Wo Cm  10/26/2012  *RADIOLOGY REPORT*  Clinical Data:   EVALUATE POSTERIOR FOSSA CIRCULATION FOR POSSIBLE DISSECTION OR STENOSIS.  HEADACHE AND DIZZINESS. DIABETIC HYPERTENSIVE PATIENT WITH HYPERLIPIDEMIA.   CT ANGIOGRAPHY HEAD AND NECK  Technique:  Multidetector CT imaging of the head and neck was performed using the standard protocol during bolus administration of intravenous contrast.  Multiplanar CT image reconstructions including MIPs were obtained to evaluate the vascular anatomy. Carotid stenosis measurements (when applicable) are obtained utilizing NASCET criteria, using the distal internal carotid diameter as the denominator.  Contrast: OMNIPAQUE IOHEXOL 350 MG/ML SOLN  Comparison:  10/25/2012 MR brain, MR angiogram circle of Willis and MR angiogram of the neck.  CTA NECK  Findings:  Heterogeneous appearance of the thyroid gland with dominant right isthmus 9.2 x 4.4 mm nodule.  This can be followed with thyroid ultrasound.  Scattered increased number of normal to top normal size neck lymph nodes.  This includes a left level I 1.2 x 0.9 x 0.9 cm lymph node. Grouping of left level II lymph nodes with short axis dimension less than 1 cm.  Significance indeterminate.  Mild prominence of the palatine tonsils with minimal asymmetry. Direct visualization can be performed to exclude mucosal abnormality.  Visualized lung apices clear.  Mild cervical spondylotic changes.  Significant dental caries.  Common origin innominate artery left common carotid artery. Ectatic proximal great vessels.  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  Reconstitution of flow at the C1 level.  Artifact extends through the right subclavian artery.  Dominant right vertebral artery without significant stenosis.  Carotid bifurcation without hemodynamically significant stenosis. Tortuosity of the distal vertical cervical segment of the internal carotid arteries bilaterally greater on the left without evidence of fibromuscular dysplasia.   Review of the MIP images confirms the above findings.  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  Reconstitution of flow at the C1 level.  No hemodynamically significant  stenosis of the right vertebral artery or carotid bifurcation. Ectatic carotid arteries greater on the left.  Ectatic  origin of the great vessels.  Increased number of normal to top normal sized lymph nodes as discussed above  of questionable significance.  Slight prominence palatine tonsils.  This may be normal for this patient.  Direct visualization would be necessary to exclude mucosal abnormality.  Heterogeneous appearance of the thyroid gland with dominant right isthmus 9.2 x 4.4 mm nodule.  This can be followed with thyroid ultrasound.  Significant dental caries.  CTA HEAD  Findings:  In this patient with occluded cervical segment of the left vertebral artery which has reconstitution of flow at the C1 level, the portion of the left vertebral artery which is visualized is markedly diminutive in size.  Tiny irregular left PICA.  Remote left inferior cerebellar infarct.  Ectatic right vertebral artery and basilar artery.  Small slightly irregular right PICA.  Small left AICA.  Mild ectasia of the cavernous segment of the internal carotid artery without significant narrowing.  Small irregular A1 segment left anterior cerebral artery.  Dominant A2 segment right anterior cerebral artery approaching an azygos configuration.  No aneurysm noted.  Middle cerebral artery branch vessel irregularity.  No intracranial enhancing mass or findings of acute infarct.  Mild exophthalmos.   Review of the MIP images confirms the above findings.  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  The intracranial portion of the left vertebral artery which is visualized is markedly diminutive in size.  Tiny irregular left PICA.  Remote inferior left cerebellar infarct.  Please see above.  Preliminary report at the time imaging by Dr. Caryl Pina.   Original Report Authenticated By: Lacy Duverney, M.D.    Dg Os Calcis Left  10/26/2012  *RADIOLOGY REPORT*  Clinical Data:  Left heel and plantar region pain.  LEFT OS CALCIS - 2+ VIEW  Comparison: None.  Findings: No fracture or bone lesion.  There are small plantar and dorsal calcaneal spurs.  The soft tissues are unremarkable.  IMPRESSION: Calcaneal spurs.  No other abnormality.   Original Report Authenticated By: Amie Portland, M.D.    Ct Head Wo Contrast  10/25/2012  *RADIOLOGY REPORT*  Clinical Data: Headache and dizziness.  CT HEAD WITHOUT CONTRAST  Technique:  Contiguous axial images were obtained from the base of the skull through the vertex without contrast.  Comparison: MRI performed earlier.  Findings: Remote left cerebellar infarct.  No acute stroke or bleed.  No mass lesions, hydrocephalus, or extra-axial fluid. Slight premature atrophy.  Chronic microvascular ischemic change. Calvarium intact.  Clear sinuses and mastoids.  IMPRESSION: Chronic left cerebellar infarct.  No acute intracranial findings.   Original Report Authenticated By: Davonna Belling, M.D.    Ct Angio Neck W/cm &/or Wo/cm  10/26/2012  *RADIOLOGY REPORT*  Clinical Data:   EVALUATE POSTERIOR FOSSA CIRCULATION FOR POSSIBLE DISSECTION OR STENOSIS.  HEADACHE AND DIZZINESS. DIABETIC HYPERTENSIVE PATIENT WITH HYPERLIPIDEMIA.  CT ANGIOGRAPHY HEAD AND NECK  Technique:  Multidetector CT imaging of the head and neck was performed using the standard protocol during bolus administration of intravenous contrast.  Multiplanar CT image reconstructions including MIPs were obtained to evaluate the vascular anatomy. Carotid stenosis measurements (when applicable) are obtained utilizing NASCET criteria, using the distal internal carotid diameter as the denominator.  Contrast: OMNIPAQUE IOHEXOL 350 MG/ML SOLN  Comparison:  10/25/2012 MR brain, MR angiogram circle of Willis and MR angiogram of the neck.  CTA NECK  Findings:  Heterogeneous appearance of the thyroid gland with dominant right isthmus 9.2 x  4.4 mm nodule.  This can be followed with thyroid ultrasound.   Scattered increased number of normal to top normal size neck lymph nodes.  This includes a left level I 1.2 x 0.9 x 0.9 cm lymph node. Grouping of left level II lymph nodes with short axis dimension less than 1 cm.  Significance indeterminate.  Mild prominence of the palatine tonsils with minimal asymmetry. Direct visualization can be performed to exclude mucosal abnormality.  Visualized lung apices clear.  Mild cervical spondylotic changes.  Significant dental caries.  Common origin innominate artery left common carotid artery. Ectatic proximal great vessels.  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  Reconstitution of flow at the C1 level.  Artifact extends through the right subclavian artery.  Dominant right vertebral artery without significant stenosis.  Carotid bifurcation without hemodynamically significant stenosis. Tortuosity of the distal vertical cervical segment of the internal carotid arteries bilaterally greater on the left without evidence of fibromuscular dysplasia.   Review of the MIP images confirms the above findings.  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  Reconstitution of flow at the C1 level.  No hemodynamically significant stenosis of the right vertebral artery or carotid bifurcation. Ectatic carotid arteries greater on the left.  Ectatic origin of the great vessels.  Increased number of normal to top normal sized lymph nodes as discussed above  of questionable significance.  Slight prominence palatine tonsils.  This may be normal for this patient.  Direct visualization would be necessary to exclude mucosal abnormality.  Heterogeneous appearance of the thyroid gland with dominant right isthmus 9.2 x 4.4 mm nodule.  This can be followed with thyroid ultrasound.  Significant dental caries.  CTA HEAD  Findings:  In this patient with occluded cervical segment of the left vertebral artery which has reconstitution of flow at the C1 level, the portion of the left  vertebral artery which is visualized is markedly diminutive in size.  Tiny irregular left PICA.  Remote left inferior cerebellar infarct.  Ectatic right vertebral artery and basilar artery.  Small slightly irregular right PICA.  Small left AICA.  Mild ectasia of the cavernous segment of the internal carotid artery without significant narrowing.  Small irregular A1 segment left anterior cerebral artery.  Dominant A2 segment right anterior cerebral artery approaching an azygos configuration.  No aneurysm noted.  Middle cerebral artery branch vessel irregularity.  No intracranial enhancing mass or findings of acute infarct.  Mild exophthalmos.   Review of the MIP images confirms the above findings.  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  The intracranial portion of the left vertebral artery which is visualized is markedly diminutive in size.  Tiny irregular left PICA.  Remote inferior left cerebellar infarct.  Please see above.  Preliminary report at the time imaging by Dr. Caryl Pina.   Original Report Authenticated By: Lacy Duverney, M.D.    Mr [facility] Wo Contrast  10/25/2012  *RADIOLOGY REPORT*  Clinical Data:    Dizziness, headache. History of hypertension. History of diabetes.  Noncompliant medications.  History of previous stroke.  MRI HEAD WITHOUT AND WITH CONTRAST MRA HEAD WITHOUT CONTRAST MRA NECK WITHOUT AND WITH CONTRAST  Technique: Multiplanar, multiecho pulse sequences of the brain and surrounding structures were obtained according to standard protocol without and with intravenous contrast.  Angiographic images of the Circle of Willis were obtained using MRA technique without intravenous contrast.  Angiographic images of  the neck were obtained using MRA technique without and with intravenous contrast.  Contrast: MultiHance 15 ml.  Comparison: CT head 03/09/2011.  MRI HEAD WITHOUT AND WITH CONTRAST  Findings:  Remote PICA territory  inferior cerebellar infarct on the left.   No acute stroke or bleed.  Mild premature atrophy.  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 Post infusion, no abnormal intracranial enhancement.  The visualized calvarium and skull base are intact. Negative orbits, sinuses and mastoids.  No pituitary or cerebellar tonsillar abnormality.  Upper cervical region unremarkable. Compared with 2012, the chronic infarct was present at that time.  IMPRESSION: Remote left cerebellar infarct.  No evidence for acute infarction. Premature atrophy and chronic microvascular ischemic change.  MRA HEAD WITHOUT CONTRAST  Findings:  Patent internal carotid arteries but dolichoectatic consistent with history of hypertension.  Also dolichoectatic basilar artery.  Right vertebral is the sole contributor to the basilar.  Left vertebral does not fill antegrade but appears to reconstitute over a 5 mm segment with slight retrograde flow. There is no proximal stenosis of the anterior, middle, there is no proximal stenosis of the middle cerebral arteries although mild nonstenotic irregularity can be seen bilaterally. Distal MCA branches show irregularity but are patent.  The right A1 ACA is the dominant contributor to the anterior cerebral arteries.  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  The proximal anterior cerebral artery distal to the anterior communicating appears azygos.  Mild nonstenotic irregularity can be seen in the ambient segment the right PCA.  Left PCA appears unremarkable.  No cerebellar branch occlusion except for left PICA which is not seen.  IMPRESSION: Intracranial atherosclerotic changes as described. 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    Left vertebral in the neck and skull base not patent with only a short segment reconstitution distally.  Nonfilling left PICA.   MRA NECK WITHOUT AND WITH CONTRAST  Findings:  Bovine origin.  Dolichoectatic proximal vasculature without focal stenosis.  Nonstenotic Right carotid bifurcation.   Tortuous cervical ICA without dissection or fibromuscular change.  Nonstenotic left carotid bifurcation.  Moderately tortuous cervical ICA without dissection or muscular change.  Right vertebral widely patent throughout its course without ostial stenosis.  Left vertebral is small and  is occluded 2 cm from its origin.  There may be faint  reconstitution distally at the skull base, but there are multiple segmental areas of irregularity.  IMPRESSION: No extracranial carotid disease.  Right vertebral dominant and sole contributor to the basilar.  Severe disease of the left vertebral.  There is no continuous vessel from its origin to the intracranial compartment.  Most of this vessel is occluded.   Original Report Authenticated By: Davonna Belling, M.D.    Mr Angiogram Neck W Wo Contrast  10/25/2012  *RADIOLOGY REPORT*  Clinical Data:    Dizziness, headache. History of hypertension. History of diabetes.  Noncompliant medications.  History of previous stroke.  MRI HEAD WITHOUT AND WITH CONTRAST MRA HEAD WITHOUT CONTRAST MRA NECK WITHOUT AND WITH CONTRAST  Technique: Multiplanar, multiecho pulse sequences of the brain and surrounding structures were obtained according to standard protocol without and with intravenous contrast.  Angiographic images of the Circle of Willis were obtained using MRA technique without intravenous contrast.  Angiographic images of the neck were obtained using MRA technique without and with intravenous contrast.  Contrast: MultiHance 15 ml.  Comparison: CT head 03/09/2011.  MRI HEAD WITHOUT AND WITH CONTRAST  Findings:  Remote PICA territory inferior cerebellar infarct on the left.   No acute stroke or bleed.  Mild premature atrophy.  Chronic microvascular ischemic change is moderately premature for the  patient's age, likely sequelae of hypertensive and/or diabetic cerebrovascular disease. Post infusion, no abnormal intracranial enhancement.  The visualized calvarium and skull base are intact. Negative orbits, sinuses and mastoids.  No pituitary or cerebellar tonsillar abnormality.  Upper cervical region unremarkable. Compared with 2012, the chronic infarct was present at that time.  IMPRESSION: Remote left cerebellar infarct.  No evidence for acute infarction. Premature atrophy and chronic microvascular ischemic change.  MRA HEAD WITHOUT CONTRAST  Findings:  Patent internal carotid arteries but dolichoectatic consistent with history of hypertension.  Also dolichoectatic basilar artery.  Right vertebral is the sole contributor to the basilar.  Left vertebral does not fill antegrade but appears to reconstitute over a 5 mm segment with slight retrograde flow. There is no proximal stenosis of the anterior, middle, there is no proximal stenosis of the middle cerebral arteries although mild nonstenotic irregularity can be seen bilaterally. Distal MCA branches show irregularity but are patent.  The right A1 ACA is the dominant contributor to the anterior cerebral arteries.  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  The proximal anterior cerebral artery distal to the anterior communicating appears azygos.  Mild nonstenotic irregularity can be seen in the ambient segment the right PCA.  Left PCA appears unremarkable.  No cerebellar branch occlusion except for left PICA which is not seen.  IMPRESSION: Intracranial atherosclerotic changes as described. 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    Left vertebral in the neck and skull base not patent with only a short segment reconstitution distally. Nonfilling left PICA.   MRA NECK WITHOUT AND WITH CONTRAST  Findings:  Bovine origin.  Dolichoectatic proximal vasculature without focal stenosis.  Nonstenotic Right  carotid bifurcation.   Tortuous cervical ICA without dissection or fibromuscular change.  Nonstenotic left carotid bifurcation.  Moderately tortuous cervical ICA without dissection or muscular change.  Right vertebral widely patent throughout its course without ostial stenosis.  Left vertebral is small and  is occluded 2 cm from its origin.  There may be faint  reconstitution distally at the skull base, but there are multiple segmental areas of irregularity.  IMPRESSION: No extracranial carotid disease.  Right vertebral dominant and sole contributor to the basilar.  Severe disease of the left vertebral.  There is no continuous vessel from its origin to the intracranial compartment.  Most of this vessel is occluded.   Original Report Authenticated By: Davonna Belling, M.D.    Mr Laqueta Jean Wo Contrast  10/25/2012  *RADIOLOGY REPORT*  Clinical Data:    Dizziness, headache. History of hypertension. History of diabetes.  Noncompliant medications.  History of previous stroke.  MRI HEAD WITHOUT AND WITH CONTRAST MRA HEAD WITHOUT CONTRAST MRA NECK WITHOUT AND WITH CONTRAST  Technique: Multiplanar, multiecho pulse sequences of the brain and surrounding structures were obtained according to standard protocol without and with intravenous contrast.  Angiographic images of the Circle of Willis were obtained using MRA technique without intravenous contrast.  Angiographic images of the neck were obtained using MRA technique without and with intravenous contrast.  Contrast: MultiHance 15 ml.  Comparison: CT head 03/09/2011.  MRI HEAD WITHOUT AND WITH CONTRAST  Findings:  Remote PICA territory inferior cerebellar  infarct on the left.   No acute stroke or bleed.  Mild premature atrophy.  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 Post infusion, no abnormal intracranial enhancement.  The visualized calvarium and skull base  are intact. Negative orbits, sinuses and mastoids.  No pituitary or cerebellar tonsillar abnormality.  Upper cervical region unremarkable. Compared with 2012, the chronic infarct was present at that time.  IMPRESSION: Remote left cerebellar infarct.  No evidence for acute infarction. Premature atrophy and chronic microvascular ischemic change.  MRA HEAD WITHOUT CONTRAST  Findings:  Patent internal carotid arteries but dolichoectatic consistent with history of hypertension.  Also dolichoectatic basilar artery.  Right vertebral is the sole contributor to the basilar.  Left vertebral does not fill antegrade but appears to reconstitute over a 5 mm segment with slight retrograde flow. There is no proximal stenosis of the anterior, middle, there is no proximal stenosis of the middle cerebral arteries although mild nonstenotic irregularity can be seen bilaterally. Distal MCA branches show irregularity but are patent.  The right A1 ACA is the dominant contributor to the anterior cerebral arteries.  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  The proximal anterior cerebral artery distal to the anterior communicating appears azygos.  Mild nonstenotic irregularity can be seen in the ambient segment the right PCA.  Left PCA appears unremarkable.  No cerebellar branch occlusion except for left PICA which is not seen.  IMPRESSION: Intracranial atherosclerotic changes as described. 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    Left vertebral in the neck and skull base not patent with only a short segment reconstitution distally. Nonfilling left PICA.   MRA NECK WITHOUT AND WITH CONTRAST  Findings:  Bovine origin.  Dolichoectatic proximal vasculature without focal stenosis.  Nonstenotic Right carotid bifurcation.   Tortuous cervical ICA without dissection or fibromuscular change.  Nonstenotic left carotid bifurcation.  Moderately tortuous cervical ICA without dissection  or muscular change.  Right vertebral widely patent throughout its course without ostial stenosis.  Left vertebral is small and  is occluded 2 cm from its origin.  There may be faint  reconstitution distally at the skull base, but there are multiple segmental areas of irregularity.  IMPRESSION: No extracranial carotid disease.  Right vertebral dominant and sole contributor to the basilar.  Severe disease of the left vertebral.  There is no continuous vessel from its origin to the intracranial compartment.  Most of this vessel is occluded.   Original Report Authenticated By: Davonna Belling, M.D.     Medications:  Scheduled:   . aspirin EC  325 mg Oral Daily  . enoxaparin (LOVENOX) injection  40 mg Subcutaneous Q24H  . influenza  inactive virus vaccine  0.5 mL Intramuscular Tomorrow-1000  . insulin aspart  0-5 Units Subcutaneous QHS  . insulin aspart  0-9 Units Subcutaneous TID WC  . pneumococcal 23 valent vaccine  0.5 mL Intramuscular Tomorrow-1000  . sodium chloride  3 mL Intravenous Q12H  . sodium chloride  3 mL Intravenous Q12H  . [COMPLETED] valproate sodium  1,000 mg Intravenous Once  . [DISCONTINUED] hydrochlorothiazide  25 mg Oral Daily   WUJ:WJXBJY chloride, acetaminophen, acetaminophen, alum & mag hydroxide-simeth, hydrALAZINE, HYDROmorphone (DILAUDID) injection, meclizine, ondansetron (ZOFRAN) IV, ondansetron, oxyCODONE, sodium chloride, zolpidem  Assessment/Plan: Probable recurrent migraine headache. No clinical evidence of acute stroke. Echocardiogram is pending. 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  Plan: Continue symptomatic treatment for headache. Antiplatelet therapy with aspirin. Will need to consider starting a statin medication for slightly elevated LDL.  C.R. Roseanne Reno, MD [facility] [phone]  10/27/2012  9:38 AM

## 2012-10-27 NOTE — Progress Notes (Signed)
[facility] PROGRESS NOTE  Brenda Ford ZOX:[id number] DOB: 1961/05/27 DOA: 10/25/2012 PCP: Sheila Oats, MD  Assessment/Plan: 1. 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 - symptomatic treatment for headache - Consider discharge next am if ok with neurology.  2. Murmur cardiac - Echocardiogram performed and no reports regarding regurgitation or reason for "cardiac murmur" EF 65-70% as such may represent systolic outflow murmur.   - Patient denies any chest pain - troponin negative  3. Pain of left heel - X ray shows calcaneal spurs - patient will need follow up with pcp and or ortho as outpatient for further evaluation or recommendations.  4. DM - not well controlled and blood sugars ranged from 126-195 - place on diabetic diet - continue to monitor blood sugars and adjust medications pending results - hgba1c 8.6 - continue SSI  5. HTN - start on low dose metoprolol succinate - reassess and adjust medications pending blood pressure readings.   Code Status: full Family Communication: no family at bedside Disposition Plan: pending clinical improvement   Consultants:  Neurology  Procedures:  Echocardiogram  CT angiogram of head  MRI/ MRA of neck and head  Antibiotics:  None  HPI/Subjective: No new complaints today.  No new reported weakness.  No acute issues reported to me overnight by patient.  Objective: Filed Vitals:   10/26/12 2000 10/27/12 0000 10/27/12 0400 10/27/12 1500  BP: 180/105 148/87 124/77 150/93  Pulse: 68 67 72 74  Temp: 98.5 F (36.9 C) 98.1 F (36.7 C) 98.3 F (36.8 C) 98.2 F (36.8 C)  TempSrc: Oral Oral Oral Oral  Resp: 16 17 16 18   Height:      Weight:      SpO2: 99% 99% 96%  94%    Intake/Output Summary (Last 24 hours) at 10/27/12 1605 Last data filed at 10/27/12 1300  Gross per 24 hour  Intake   1080 ml  Output      1 ml  Net   1079 ml   Filed Weights   10/26/12 0615  Weight: 83.28 kg (183 lb 9.6 oz)    Exam:   General:  Pt in NAD, Alert and Awake  Cardiovascular: RRR, systolic grade 1-2   Respiratory: CTA BL, no wheezes  Abdomen: soft, NT, ND  Data Reviewed: Basic Metabolic Panel:  Lab 10/27/12 8119 10/25/12 1834  NA 135 137  K 3.6 3.8  CL 97 98  CO2 27 29  GLUCOSE 149* 138*  BUN 16 11  CREATININE 0.89 0.89  CALCIUM 9.7 10.5  MG -- --  PHOS -- --   Liver Function Tests:  Lab 10/25/12 1834  AST 26  ALT 25  ALKPHOS 121*  BILITOT 0.5  PROT 9.0*  ALBUMIN 4.2   No results found for this basename: LIPASE:5,AMYLASE:5 in the last 168 hours No results found for this basename: AMMONIA:5 in the last 168 hours CBC:  Lab 10/27/12 0555 10/25/12 1834  WBC 7.6 8.5  NEUTROABS -- 5.4  HGB 11.6* 13.0  HCT 35.9* 39.2  MCV 75.9* 76.1*  PLT 283 325   Cardiac Enzymes:  Lab 10/25/12 2021  CKTOTAL --  CKMB --  CKMBINDEX --  TROPONINI <0.30   BNP (last 3 results) No results found for this basename: PROBNP:3 in  the last 8760 hours CBG:  Lab 10/27/12 1154 10/27/12 0726 10/26/12 2022 10/26/12 1648 10/26/12 1151  GLUCAP 195* 168* 126* 167* 174*    No results found for this or any previous visit (from the past 240 hour(s)).   Studies: Ct Angio Head W/cm &/or Wo Cm  10/26/2012  *RADIOLOGY REPORT*  Clinical Data:   EVALUATE POSTERIOR FOSSA CIRCULATION FOR POSSIBLE DISSECTION OR STENOSIS.  HEADACHE AND DIZZINESS. DIABETIC HYPERTENSIVE PATIENT WITH HYPERLIPIDEMIA.  CT ANGIOGRAPHY HEAD AND NECK  Technique:  Multidetector CT imaging of the head and neck was performed using the standard protocol during bolus administration of intravenous contrast.  Multiplanar CT image reconstructions including MIPs were obtained to evaluate the vascular  anatomy. Carotid stenosis measurements (when applicable) are obtained utilizing NASCET criteria, using the distal internal carotid diameter as the denominator.  Contrast: OMNIPAQUE IOHEXOL 350 MG/ML SOLN  Comparison:  10/25/2012 MR brain, MR angiogram circle of Willis and MR angiogram of the neck.  CTA NECK  Findings:  Heterogeneous appearance of the thyroid gland with dominant right isthmus 9.2 x 4.4 mm nodule.  This can be followed with thyroid ultrasound.  Scattered increased number of normal to top normal size neck lymph nodes.  This includes a left level I 1.2 x 0.9 x 0.9 cm lymph node. Grouping of left level II lymph nodes with short axis dimension less than 1 cm.  Significance indeterminate.  Mild prominence of the palatine tonsils with minimal asymmetry. Direct visualization can be performed to exclude mucosal abnormality.  Visualized lung apices clear.  Mild cervical spondylotic changes.  Significant dental caries.  Common origin innominate artery left common carotid artery. Ectatic proximal great vessels.  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  Reconstitution of flow at the C1 level.  Artifact extends through the right subclavian artery.  Dominant right vertebral artery without significant stenosis.  Carotid bifurcation without hemodynamically significant stenosis. Tortuosity of the distal vertical cervical segment of the internal carotid arteries bilaterally greater on the left without evidence of fibromuscular dysplasia.   Review of the MIP images confirms the above findings.  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  Reconstitution of flow at the C1 level.  No hemodynamically significant stenosis of the right vertebral artery or carotid bifurcation. Ectatic carotid arteries greater on the left.  Ectatic origin of the great vessels.  Increased number of normal to top normal sized lymph nodes as discussed above  of questionable significance.  Slight prominence  palatine tonsils.  This may be normal for this patient.  Direct visualization would be necessary to exclude mucosal abnormality.  Heterogeneous appearance of the thyroid gland with dominant right isthmus 9.2 x 4.4 mm nodule.  This can be followed with thyroid ultrasound.  Significant dental caries.  CTA HEAD  Findings:  In this patient with occluded cervical segment of the left vertebral artery which has reconstitution of flow at the C1 level, the portion of the left vertebral artery which is visualized is markedly diminutive in size.  Tiny irregular left PICA.  Remote left inferior cerebellar infarct.  Ectatic right vertebral artery and basilar artery.  Small slightly irregular right PICA.  Small left AICA.  Mild ectasia of the cavernous segment of the internal carotid artery without significant narrowing.  Small irregular A1 segment left anterior cerebral artery.  Dominant A2 segment right anterior cerebral artery approaching an azygos configuration.  No aneurysm noted.  Middle cerebral artery branch vessel irregularity.  No intracranial enhancing  mass or findings of acute infarct.  Mild exophthalmos.   Review of the MIP images confirms the above findings.  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  The intracranial portion of the left vertebral artery which is visualized is markedly diminutive in size.  Tiny irregular left PICA.  Remote inferior left cerebellar infarct.  Please see above.  Preliminary report at the time imaging by Dr. Caryl Pina.   Original Report Authenticated By: Lacy Duverney, M.D.    Dg Os Calcis Left  10/26/2012  *RADIOLOGY REPORT*  Clinical Data: Left heel and plantar region pain.  LEFT OS CALCIS - 2+ VIEW  Comparison: None.  Findings: No fracture or bone lesion.  There are small plantar and dorsal calcaneal spurs.  The soft tissues are unremarkable.  IMPRESSION: Calcaneal spurs.  No other abnormality.   Original Report  Authenticated By: Amie Portland, M.D.    Ct Head Wo Contrast  10/25/2012  *RADIOLOGY REPORT*  Clinical Data: Headache and dizziness.  CT HEAD WITHOUT CONTRAST  Technique:  Contiguous axial images were obtained from the base of the skull through the vertex without contrast.  Comparison: MRI performed earlier.  Findings: Remote left cerebellar infarct.  No acute stroke or bleed.  No mass lesions, hydrocephalus, or extra-axial fluid. Slight premature atrophy.  Chronic microvascular ischemic change. Calvarium intact.  Clear sinuses and mastoids.  IMPRESSION: Chronic left cerebellar infarct.  No acute intracranial findings.   Original Report Authenticated By: Davonna Belling, M.D.    Ct Angio Neck W/cm &/or Wo/cm  10/26/2012  *RADIOLOGY REPORT*  Clinical Data:   EVALUATE POSTERIOR FOSSA CIRCULATION FOR POSSIBLE DISSECTION OR STENOSIS.  HEADACHE AND DIZZINESS. DIABETIC HYPERTENSIVE PATIENT WITH HYPERLIPIDEMIA.  CT ANGIOGRAPHY HEAD AND NECK  Technique:  Multidetector CT imaging of the head and neck was performed using the standard protocol during bolus administration of intravenous contrast.  Multiplanar CT image reconstructions including MIPs were obtained to evaluate the vascular anatomy. Carotid stenosis measurements (when applicable) are obtained utilizing NASCET criteria, using the distal internal carotid diameter as the denominator.  Contrast: OMNIPAQUE IOHEXOL 350 MG/ML SOLN  Comparison:  10/25/2012 MR brain, MR angiogram circle of Willis and MR angiogram of the neck.  CTA NECK  Findings:  Heterogeneous appearance of the thyroid gland with dominant right isthmus 9.2 x 4.4 mm nodule.  This can be followed with thyroid ultrasound.  Scattered increased number of normal to top normal size neck lymph nodes.  This includes a left level I 1.2 x 0.9 x 0.9 cm lymph node. Grouping of left level II lymph nodes with short axis dimension less than 1 cm.  Significance indeterminate.  Mild prominence of the palatine  tonsils with minimal asymmetry. Direct visualization can be performed to exclude mucosal abnormality.  Visualized lung apices clear.  Mild cervical spondylotic changes.  Significant dental caries.  Common origin innominate artery left common carotid artery. Ectatic proximal great vessels.  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  Reconstitution of flow at the C1 level.  Artifact extends through the right subclavian artery.  Dominant right vertebral artery without significant stenosis.  Carotid bifurcation without hemodynamically significant stenosis. Tortuosity of the distal vertical cervical segment of the internal carotid arteries bilaterally greater on the left without evidence of fibromuscular dysplasia.   Review of the MIP images confirms the above findings.  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  Reconstitution of flow at the C1  level.  No hemodynamically significant stenosis of the right vertebral artery or carotid bifurcation. Ectatic carotid arteries greater on the left.  Ectatic origin of the great vessels.  Increased number of normal to top normal sized lymph nodes as discussed above  of questionable significance.  Slight prominence palatine tonsils.  This may be normal for this patient.  Direct visualization would be necessary to exclude mucosal abnormality.  Heterogeneous appearance of the thyroid gland with dominant right isthmus 9.2 x 4.4 mm nodule.  This can be followed with thyroid ultrasound.  Significant dental caries.  CTA HEAD  Findings:  In this patient with occluded cervical segment of the left vertebral artery which has reconstitution of flow at the C1 level, the portion of the left vertebral artery which is visualized is markedly diminutive in size.  Tiny irregular left PICA.  Remote left inferior cerebellar infarct.  Ectatic right vertebral artery and basilar artery.  Small slightly irregular right PICA.  Small left AICA.  Mild ectasia of the cavernous  segment of the internal carotid artery without significant narrowing.  Small irregular A1 segment left anterior cerebral artery.  Dominant A2 segment right anterior cerebral artery approaching an azygos configuration.  No aneurysm noted.  Middle cerebral artery branch vessel irregularity.  No intracranial enhancing mass or findings of acute infarct.  Mild exophthalmos.   Review of the MIP images confirms the above findings.  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  The intracranial portion of the left vertebral artery which is visualized is markedly diminutive in size.  Tiny irregular left PICA.  Remote inferior left cerebellar infarct.  Please see above.  Preliminary report at the time imaging by Dr. Caryl Pina.   Original Report Authenticated By: Lacy Duverney, M.D.    Mr [facility] Wo Contrast  10/25/2012  *RADIOLOGY REPORT*  Clinical Data:    Dizziness, headache. History of hypertension. History of diabetes.  Noncompliant medications.  History of previous stroke.  MRI HEAD WITHOUT AND WITH CONTRAST MRA HEAD WITHOUT CONTRAST MRA NECK WITHOUT AND WITH CONTRAST  Technique: Multiplanar, multiecho pulse sequences of the brain and surrounding structures were obtained according to standard protocol without and with intravenous contrast.  Angiographic images of the Circle of Willis were obtained using MRA technique without intravenous contrast.  Angiographic images of the neck were obtained using MRA technique without and with intravenous contrast.  Contrast: MultiHance 15 ml.  Comparison: CT head 03/09/2011.  MRI HEAD WITHOUT AND WITH CONTRAST  Findings:  Remote PICA territory inferior cerebellar infarct on the left.   No acute stroke or bleed.  Mild premature atrophy.  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 Post infusion, no  abnormal intracranial enhancement.  The visualized calvarium and skull base are intact. Negative orbits, sinuses and mastoids.  No pituitary or cerebellar tonsillar abnormality.  Upper cervical region unremarkable. Compared with 2012, the chronic infarct was present at that time.  IMPRESSION: Remote left cerebellar infarct.  No evidence for acute infarction. Premature atrophy and chronic microvascular ischemic change.  MRA HEAD WITHOUT CONTRAST  Findings:  Patent internal carotid arteries but dolichoectatic consistent with history of hypertension.  Also dolichoectatic basilar artery.  Right vertebral is the sole contributor to the basilar.  Left vertebral does not fill antegrade but appears to reconstitute over a 5 mm segment with slight retrograde flow. There is no proximal stenosis of the  anterior, middle, there is no proximal stenosis of the middle cerebral arteries although mild nonstenotic irregularity can be seen bilaterally. Distal MCA branches show irregularity but are patent.  The right A1 ACA is the dominant contributor to the anterior cerebral arteries.  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  The proximal anterior cerebral artery distal to the anterior communicating appears azygos.  Mild nonstenotic irregularity can be seen in the ambient segment the right PCA.  Left PCA appears unremarkable.  No cerebellar branch occlusion except for left PICA which is not seen.  IMPRESSION: Intracranial atherosclerotic changes as described. 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    Left vertebral in the neck and skull base not patent with only a short segment reconstitution distally. Nonfilling left PICA.   MRA NECK WITHOUT AND WITH CONTRAST  Findings:  Bovine origin.  Dolichoectatic proximal vasculature without focal stenosis.  Nonstenotic Right carotid bifurcation.   Tortuous cervical ICA without dissection or fibromuscular change.  Nonstenotic  left carotid bifurcation.  Moderately tortuous cervical ICA without dissection or muscular change.  Right vertebral widely patent throughout its course without ostial stenosis.  Left vertebral is small and  is occluded 2 cm from its origin.  There may be faint  reconstitution distally at the skull base, but there are multiple segmental areas of irregularity.  IMPRESSION: No extracranial carotid disease.  Right vertebral dominant and sole contributor to the basilar.  Severe disease of the left vertebral.  There is no continuous vessel from its origin to the intracranial compartment.  Most of this vessel is occluded.   Original Report Authenticated By: Davonna Belling, M.D.    Mr Angiogram Neck W Wo Contrast  10/25/2012  *RADIOLOGY REPORT*  Clinical Data:    Dizziness, headache. History of hypertension. History of diabetes.  Noncompliant medications.  History of previous stroke.  MRI HEAD WITHOUT AND WITH CONTRAST MRA HEAD WITHOUT CONTRAST MRA NECK WITHOUT AND WITH CONTRAST  Technique: Multiplanar, multiecho pulse sequences of the brain and surrounding structures were obtained according to standard protocol without and with intravenous contrast.  Angiographic images of the Circle of Willis were obtained using MRA technique without intravenous contrast.  Angiographic images of the neck were obtained using MRA technique without and with intravenous contrast.  Contrast: MultiHance 15 ml.  Comparison: CT head 03/09/2011.  MRI HEAD WITHOUT AND WITH CONTRAST  Findings:  Remote PICA territory inferior cerebellar infarct on the left.   No acute stroke or bleed.  Mild premature atrophy.  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 Post infusion, no abnormal intracranial enhancement.  The visualized calvarium and skull base are intact. Negative orbits, sinuses and mastoids.  No pituitary or cerebellar tonsillar  abnormality.  Upper cervical region unremarkable. Compared with 2012, the chronic infarct was present at that time.  IMPRESSION: Remote left cerebellar infarct.  No evidence for acute infarction. Premature atrophy and chronic microvascular ischemic change.  MRA HEAD WITHOUT CONTRAST  Findings:  Patent internal carotid arteries but dolichoectatic consistent with history of hypertension.  Also dolichoectatic basilar artery.  Right vertebral is the sole contributor to the basilar.  Left vertebral does not fill antegrade but appears to reconstitute over a 5 mm segment with slight retrograde flow. There is no proximal stenosis of the anterior, middle, there is no proximal stenosis of the middle cerebral arteries although mild nonstenotic irregularity can be  seen bilaterally. Distal MCA branches show irregularity but are patent.  The right A1 ACA is the dominant contributor to the anterior cerebral arteries.  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  The proximal anterior cerebral artery distal to the anterior communicating appears azygos.  Mild nonstenotic irregularity can be seen in the ambient segment the right PCA.  Left PCA appears unremarkable.  No cerebellar branch occlusion except for left PICA which is not seen.  IMPRESSION: Intracranial atherosclerotic changes as described. 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    Left vertebral in the neck and skull base not patent with only a short segment reconstitution distally. Nonfilling left PICA.   MRA NECK WITHOUT AND WITH CONTRAST  Findings:  Bovine origin.  Dolichoectatic proximal vasculature without focal stenosis.  Nonstenotic Right carotid bifurcation.   Tortuous cervical ICA without dissection or fibromuscular change.  Nonstenotic left carotid bifurcation.  Moderately tortuous cervical ICA without dissection or muscular change.  Right vertebral widely patent throughout its course without ostial  stenosis.  Left vertebral is small and  is occluded 2 cm from its origin.  There may be faint  reconstitution distally at the skull base, but there are multiple segmental areas of irregularity.  IMPRESSION: No extracranial carotid disease.  Right vertebral dominant and sole contributor to the basilar.  Severe disease of the left vertebral.  There is no continuous vessel from its origin to the intracranial compartment.  Most of this vessel is occluded.   Original Report Authenticated By: Davonna Belling, M.D.    Mr [facility] Wo Contrast  10/25/2012  *RADIOLOGY REPORT*  Clinical Data:    Dizziness, headache. History of hypertension. History of diabetes.  Noncompliant medications.  History of previous stroke.  MRI HEAD WITHOUT AND WITH CONTRAST MRA HEAD WITHOUT CONTRAST MRA NECK WITHOUT AND WITH CONTRAST  Technique: Multiplanar, multiecho pulse sequences of the brain and surrounding structures were obtained according to standard protocol without and with intravenous contrast.  Angiographic images of the Circle of Willis were obtained using MRA technique without intravenous contrast.  Angiographic images of the neck were obtained using MRA technique without and with intravenous contrast.  Contrast: MultiHance 15 ml.  Comparison: CT head 03/09/2011.  MRI HEAD WITHOUT AND WITH CONTRAST  Findings:  Remote PICA territory inferior cerebellar infarct on the left.   No acute stroke or bleed.  Mild premature atrophy.  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 Post infusion, no abnormal intracranial enhancement.  The visualized calvarium and skull base are intact. Negative orbits, sinuses and mastoids.  No pituitary or cerebellar tonsillar abnormality.  Upper cervical region unremarkable. Compared with 2012, the chronic infarct was present at that time.  IMPRESSION: Remote left cerebellar infarct.  No evidence for  acute infarction. Premature atrophy and chronic microvascular ischemic change.  MRA HEAD WITHOUT CONTRAST  Findings:  Patent internal carotid arteries but dolichoectatic consistent with history of hypertension.  Also dolichoectatic basilar artery.  Right vertebral is the sole contributor to the basilar.  Left vertebral does not fill antegrade but appears to reconstitute over a 5 mm segment with slight retrograde flow. There is no proximal stenosis of the anterior, middle, there is no proximal stenosis of the middle cerebral arteries although mild nonstenotic irregularity can be seen bilaterally. Distal MCA branches show irregularity but are patent.  The right A1 ACA is the dominant contributor  to the anterior cerebral arteries.  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  The proximal anterior cerebral artery distal to the anterior communicating appears azygos.  Mild nonstenotic irregularity can be seen in the ambient segment the right PCA.  Left PCA appears unremarkable.  No cerebellar branch occlusion except for left PICA which is not seen.  IMPRESSION: Intracranial atherosclerotic changes as described. 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    Left vertebral in the neck and skull base not patent with only a short segment reconstitution distally. Nonfilling left PICA.   MRA NECK WITHOUT AND WITH CONTRAST  Findings:  Bovine origin.  Dolichoectatic proximal vasculature without focal stenosis.  Nonstenotic Right carotid bifurcation.   Tortuous cervical ICA without dissection or fibromuscular change.  Nonstenotic left carotid bifurcation.  Moderately tortuous cervical ICA without dissection or muscular change.  Right vertebral widely patent throughout its course without ostial stenosis.  Left vertebral is small and  is occluded 2 cm from its origin.  There may be faint  reconstitution distally at the skull base, but there are multiple segmental areas of  irregularity.  IMPRESSION: No extracranial carotid disease.  Right vertebral dominant and sole contributor to the basilar.  Severe disease of the left vertebral.  There is no continuous vessel from its origin to the intracranial compartment.  Most of this vessel is occluded.   Original Report Authenticated By: Davonna Belling, M.D.     Scheduled Meds:    . aspirin EC  325 mg Oral Daily  . enoxaparin (LOVENOX) injection  40 mg Subcutaneous Q24H  . [COMPLETED] influenza  inactive virus vaccine  0.5 mL Intramuscular Tomorrow-1000  . insulin aspart  0-5 Units Subcutaneous QHS  . insulin aspart  0-9 Units Subcutaneous TID WC  . [COMPLETED] pneumococcal 23 valent vaccine  0.5 mL Intramuscular Tomorrow-1000  . sodium chloride  3 mL Intravenous Q12H  . sodium chloride  3 mL Intravenous Q12H   Continuous Infusions:   Principal Problem:  *Vertigo Active Problems:  Cerebrovascular disease  Hypertension  Diabetes mellitus  Hyperlipidemia  Pain of left heel  Murmur, cardiac    Time spent: > 35 minutes    Penny Pia  [facility] Pager [phone]. If 8PM-8AM, please contact night-coverage at [email or web address], password [facility] 10/27/2012, 4:05 PM  LOS: 2 days

## 2012-10-27 NOTE — Progress Notes (Signed)
*  PRELIMINARY RESULTS* Echocardiogram 2D Echocardiogram has been performed.  Brenda Ford 10/27/2012, 12:03 PM

## 2012-10-28 LAB — GLUCOSE, CAPILLARY
Glucose-Capillary: 154 mg/dL — ABNORMAL HIGH (ref 70–99)
Glucose-Capillary: 152 mg/dL — ABNORMAL HIGH (ref 70–99)

## 2012-10-28 MED ORDER — DEXTROSE 5 % IV SOLN
500.0000 mg | Freq: Three times a day (TID) | INTRAVENOUS | Status: DC
  Administered 2012-10-28 – 2012-10-29 (×4): 500 mg via INTRAVENOUS
  Filled 2012-10-28 (×7): qty 5

## 2012-10-28 MED ORDER — VALPROATE SODIUM 500 MG/5ML IV SOLN
500.0000 mg | Freq: Once | INTRAVENOUS | Status: AC
  Administered 2012-10-28: 500 mg via INTRAVENOUS
  Filled 2012-10-28: qty 5

## 2012-10-28 MED ORDER — OXYCODONE HCL 5 MG PO TABS
5.0000 mg | ORAL_TABLET | ORAL | Status: DC | PRN
  Administered 2012-10-28 – 2012-10-30 (×7): 10 mg via ORAL
  Filled 2012-10-28 (×7): qty 2

## 2012-10-28 NOTE — Progress Notes (Signed)
Subjective: Complaining of headache intensity of 10 over 10. She also indicates tenderness the top of her head as well as right temporal area. There is no associated nausea.  Objective: Current vital signs: BP 152/95  Pulse 66  Temp 98.3 F (36.8 C) (Oral)  Resp 20  Ht 5\' 1"  (1.549 m)  Wt 83.28 kg (183 lb 9.6 oz)  BMI 34.69 kg/m2  SpO2 95%  LMP 02/11/2011  Neurologic Exam: Alert and patient does not appear to be in any acute distress. She was watching television went to room. There was no apparent photophobia as well. Mental status was normal. Speech was normal.  Medications:  Scheduled:   . aspirin EC  325 mg Oral Daily  . enoxaparin (LOVENOX) injection  40 mg Subcutaneous Q24H  . [COMPLETED] influenza  inactive virus vaccine  0.5 mL Intramuscular Tomorrow-1000  . insulin aspart  0-5 Units Subcutaneous QHS  . insulin aspart  0-9 Units Subcutaneous TID WC  . methocarbamol (ROBAXIN) IV  500 mg Intravenous Q8H  . metoprolol succinate  12.5 mg Oral Daily  . [COMPLETED] pneumococcal 23 valent vaccine  0.5 mL Intramuscular Tomorrow-1000  . sodium chloride  3 mL Intravenous Q12H  . sodium chloride  3 mL Intravenous Q12H   ZOX:WRUEAV chloride, acetaminophen, acetaminophen, alum & mag hydroxide-simeth, hydrALAZINE, HYDROmorphone (DILAUDID) injection, meclizine, ondansetron (ZOFRAN) IV, ondansetron, oxyCODONE, sodium chloride, zolpidem  Assessment/Plan: Intractable headache, most likely mixed etiology with history of migraine headaches and probable component of muscle contraction with current headache. She showed no indications of recurrent stroke including with MRI studies. Cerebrovascular disease is stable.  Recommendations: 1. Trial of Robaxin 500 mg IV every 8 hours. 2. He continued to moderate for symptomatic treatment of severe pain.  C.R. Roseanne Reno, MD [facility] [phone]  10/28/2012  8:40 AM

## 2012-10-28 NOTE — Progress Notes (Signed)
[facility] PROGRESS NOTE  Brenda Ford ZOX:[id number] DOB: 09/17/1961 DOA: 10/25/2012 PCP: Sheila Oats, MD  Assessment/Plan: 1. 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   - given complaint of 10/10 of headache will wait to see if Robaxin helps patient with her discomfort.  No difficulty with speech or new neurological weakness reported.  2. Murmur cardiac - Echocardiogram performed and no reports regarding regurgitation or reason for "cardiac murmur" EF 65-70% as such may represent systolic outflow murmur.   - Patient denies any chest pain - troponin negative  3. Pain of left heel - X ray shows calcaneal spurs - patient will need follow up with pcp and or ortho as outpatient for further evaluation or recommendations.  4. DM - relatively well controlled and blood sugars ranged from 126-195 - placed on diabetic diet - continue to monitor blood sugars and adjust medications pending results - hgba1c 8.6 - continue SSI  5. HTN - start on low dose metoprolol succinate - reassess and adjust medications pending blood pressure readings. - Likely headache contributing.  At this point will see if with any relief of HA patient's BP comes down before readjusting her blood pressure medication.   Code Status: full Family Communication: no family at bedside Disposition Plan: pending clinical improvement   Consultants:  Neurology  Procedures:  Echocardiogram  CT angiogram of head  MRI/ MRA of neck and head  Antibiotics:  None  HPI/Subjective: Complaining of headache.  Denies any new weakness in extremities or slurred speech.  No acute issues reported overnight  Objective: Filed  Vitals:   10/27/12 2000 10/28/12 0000 10/28/12 0400 10/28/12 0800  BP: 165/95 168/99 125/72 152/95  Pulse: 65 81 65 66  Temp: 98.4 F (36.9 C) 98.2 F (36.8 C) 98.2 F (36.8 C) 98.3 F (36.8 C)  TempSrc:    Oral  Resp: 16 18 18 20   Height:      Weight:      SpO2: 97% 94% 94% 95%    Intake/Output Summary (Last 24 hours) at 10/28/12 1046 Last data filed at 10/28/12 0801  Gross per 24 hour  Intake    600 ml  Output    100 ml  Net    500 ml   Filed Weights   10/26/12 0615  Weight: 83.28 kg (183 lb 9.6 oz)    Exam:   General:  Pt in NAD, Alert and Awake  Cardiovascular: RRR, systolic grade 1-2   Respiratory: CTA BL, no wheezes  Abdomen: soft, NT, ND  Data Reviewed: Basic Metabolic Panel:  Lab 10/27/12 8119 10/25/12 1834  NA 135 137  K 3.6 3.8  CL 97 98  CO2 27 29  GLUCOSE 149* 138*  BUN 16 11  CREATININE 0.89 0.89  CALCIUM 9.7 10.5  MG -- --  PHOS -- --   Liver Function Tests:  Lab 10/25/12 1834  AST 26  ALT 25  ALKPHOS 121*  BILITOT 0.5  PROT 9.0*  ALBUMIN 4.2   No results found for this basename: LIPASE:5,AMYLASE:5 in the last 168 hours No results found for this basename: AMMONIA:5 in the last 168 hours CBC:  Lab 10/27/12 0555 10/25/12 1834  WBC 7.6 8.5  NEUTROABS -- 5.4  HGB  11.6* 13.0  HCT 35.9* 39.2  MCV 75.9* 76.1*  PLT 283 325   Cardiac Enzymes:  Lab 10/25/12 2021  CKTOTAL --  CKMB --  CKMBINDEX --  TROPONINI <0.30   BNP (last 3 results) No results found for this basename: PROBNP:3 in the last 8760 hours CBG:  Lab 10/28/12 0713 10/27/12 2039 10/27/12 1702 10/27/12 1154 10/27/12 0726  GLUCAP 154* 145* 170* 195* 168*    No results found for this or any previous visit (from the past 240 hour(s)).   Studies: No results found.  Scheduled Meds:    . aspirin EC  325 mg Oral Daily  . enoxaparin (LOVENOX) injection  40 mg Subcutaneous Q24H  . insulin aspart  0-5 Units Subcutaneous QHS  . insulin aspart  0-9 Units  Subcutaneous TID WC  . methocarbamol (ROBAXIN) IV  500 mg Intravenous Q8H  . metoprolol succinate  12.5 mg Oral Daily  . sodium chloride  3 mL Intravenous Q12H  . sodium chloride  3 mL Intravenous Q12H   Continuous Infusions:   Principal Problem:  *Vertigo Active Problems:  Cerebrovascular disease  Hypertension  Diabetes mellitus  Hyperlipidemia  Pain of left heel    Time spent: > 35 minutes    Penny Pia  [facility] Pager [phone]. If 8PM-8AM, please contact night-coverage at [email or web address], password [facility] 10/28/2012, 10:46 AM  LOS: 3 days

## 2012-10-29 LAB — GLUCOSE, CAPILLARY
Glucose-Capillary: 150 mg/dL — ABNORMAL HIGH (ref 70–99)
Glucose-Capillary: 161 mg/dL — ABNORMAL HIGH (ref 70–99)
Glucose-Capillary: 152 mg/dL — ABNORMAL HIGH (ref 70–99)

## 2012-10-29 MED ORDER — ALPRAZOLAM 0.5 MG PO TABS
0.5000 mg | ORAL_TABLET | Freq: Three times a day (TID) | ORAL | Status: DC
  Administered 2012-10-29 – 2012-10-30 (×3): 0.5 mg via ORAL
  Filled 2012-10-29 (×3): qty 1

## 2012-10-29 MED ORDER — MORPHINE SULFATE 2 MG/ML IJ SOLN
2.0000 mg | Freq: Once | INTRAMUSCULAR | Status: AC
  Administered 2012-10-29: 2 mg via INTRAVENOUS
  Filled 2012-10-29: qty 1

## 2012-10-29 MED ORDER — NAPROXEN 500 MG PO TABS
500.0000 mg | ORAL_TABLET | Freq: Two times a day (BID) | ORAL | Status: DC
  Administered 2012-10-29 – 2012-10-30 (×3): 500 mg via ORAL
  Filled 2012-10-29 (×5): qty 1

## 2012-10-29 NOTE — Progress Notes (Signed)
[facility] PROGRESS NOTE  Brenda Ford ZOX:[id number] DOB: Sep 10, 1961 DOA: 10/25/2012 PCP: Sheila Oats, MD  Assessment/Plan: 1. 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  Has tried morphine, Robaxin, and naproxen today. - symptomatic treatment for headache.    2. Systolic outflow murmur - echo cardiogram with EF of 65-70% no reports of structurally abnormal valves.   3. Pain of left heel - X ray shows calcaneal spurs - patient will need follow up with pcp and or ortho as outpatient for further evaluation or recommendations.  4. DM - relatively well controlled and blood sugars ranged from 145-161 - placed on diabetic diet - continue to monitor blood sugars and adjust medications pending results - hgba1c 8.6 - continue SSI  5. HTN - start on low dose metoprolol succinate - reassess and adjust medications pending blood pressure readings.  Last blood pressure recorded 137/86 - Likely headache contributing   Code Status: full Family Communication: no family at bedside Disposition Plan: pending clinical improvement   Consultants:  Neurology  Procedures:  Echocardiogram  CT angiogram of head  MRI/ MRA of neck and head  Antibiotics:  None  HPI/Subjective: Patient at this point is still complaining of head aches with medication adding minimal relief.  Denies any weakness in extremities  Objective: Filed Vitals:   10/29/12 0045 10/29/12 0500 10/29/12 1000 10/29/12 1400  BP: 146/79 137/81 138/85 137/86  Pulse: 56 70 65 62  Temp: 97.8 F (36.6 C) 98.3 F (36.8 C) 98.6 F (37 C) 98.1 F (36.7 C)  TempSrc: Oral Oral Oral Oral  Resp: 18 18 18 18   Height:       Weight:      SpO2: 98% 97% 99% 99%    Intake/Output Summary (Last 24 hours) at 10/29/12 1707 Last data filed at 10/29/12 1251  Gross per 24 hour  Intake    600 ml  Output      0 ml  Net    600 ml   Filed Weights   10/26/12 0615  Weight: 83.28 kg (183 lb 9.6 oz)    Exam:   General:  Pt in NAD, Alert and Awake  Cardiovascular: RRR, systolic outflow murmur  Respiratory: CTA BL, no wheezes  Abdomen: soft, NT, ND  Data Reviewed: Basic Metabolic Panel:  Lab 10/27/12 8119 10/25/12 1834  NA 135 137  K 3.6 3.8  CL 97 98  CO2 27 29  GLUCOSE 149* 138*  BUN 16 11  CREATININE 0.89 0.89  CALCIUM 9.7 10.5  MG -- --  PHOS -- --   Liver Function Tests:  Lab 10/25/12 1834  AST 26  ALT 25  ALKPHOS 121*  BILITOT 0.5  PROT 9.0*  ALBUMIN 4.2   No results found for this basename: LIPASE:5,AMYLASE:5 in the last 168 hours No results found for this basename: AMMONIA:5 in the last 168 hours CBC:  Lab 10/27/12 0555 10/25/12 1834  WBC 7.6 8.5  NEUTROABS -- 5.4  HGB 11.6* 13.0  HCT 35.9* 39.2  MCV 75.9* 76.1*  PLT 283 325   Cardiac Enzymes:  Lab 10/25/12 2021  CKTOTAL --  CKMB --  CKMBINDEX --  TROPONINI <0.30   BNP (last 3 results) No results found for this  basename: PROBNP:3 in the last 8760 hours CBG:  Lab 10/29/12 1118 10/29/12 0746 10/28/12 2137 10/28/12 1645 10/28/12 1306  GLUCAP 160* 150* 152* 154* 155*    No results found for this or any previous visit (from the past 240 hour(s)).   Studies: No results found.  Scheduled Meds:    . ALPRAZolam  0.5 mg Oral TID  . aspirin EC  325 mg Oral Daily  . enoxaparin (LOVENOX) injection  40 mg Subcutaneous Q24H  . insulin aspart  0-5 Units Subcutaneous QHS  . insulin aspart  0-9 Units Subcutaneous TID WC  . metoprolol succinate  12.5 mg Oral Daily  . [COMPLETED]  morphine injection  2 mg Intravenous Once  . naproxen  500 mg Oral BID WC  . sodium chloride  3 mL Intravenous Q12H  . sodium chloride  3 mL  Intravenous Q12H  . [DISCONTINUED] methocarbamol (ROBAXIN) IV  500 mg Intravenous Q8H   Continuous Infusions:   Principal Problem:  *Vertigo Active Problems:  Cerebrovascular disease  Hypertension  Diabetes mellitus  Hyperlipidemia  Pain of left heel    Time spent: > 35 minutes    Penny Pia  [facility] Pager [phone]. If 8PM-8AM, please contact night-coverage at [email or web address], password [facility] 10/29/2012, 5:07 PM  LOS: 4 days

## 2012-10-29 NOTE — Care Management Note (Signed)
    Page 1 of 1   10/30/2012     11:35:52 AM   CARE MANAGEMENT NOTE 10/30/2012  Patient:  Brenda Ford, Brenda Ford   Account Number:  [id number]  Date Initiated:  10/29/2012  Documentation initiated by:  Donn Pierini  Subjective/Objective Assessment:   Pt admitted with vertigo/HA     Action/Plan:   PTA pt lived at home   Anticipated DC Date:  10/30/2012   Anticipated DC Plan:  HOME/SELF CARE      DC Planning Services  CM consult      Choice offered to / List presented to:             Status of service:  Completed, signed off Medicare Important Message given?   (If response is "NO", the following Medicare IM given date fields will be blank) Date Medicare IM given:   Date Additional Medicare IM given:    Discharge Disposition:  HOME/SELF CARE  Per UR Regulation:  Reviewed for med. necessity/level of care/duration of stay  If discussed at Long Length of Stay Meetings, dates discussed:    Comments:  10/30/12- 1130- Donn Pierini RN,BSN [phone] [facility] # provided to pt for assistance in finding a PCP and neuro follow up.  10/29/12- 1640- Donn Pierini RN, BSN [phone] Referral received for medication assistance needs- spoke with pt at bedside- per conversation pt states she has both medicare and medicaid and is able to get her medications without difficulty- she uses [facility]. Pt states that she has no d/c needs or concerns at this time. NCM to follow

## 2012-10-29 NOTE — Progress Notes (Signed)
Subjective: Still complaining of headache with intensity of 8/10. No nausea. Slight photophobia. She is currently on oxycodone and coronal Robaxin. She was given a repeat dose of Depacon 500 mg yesterday.  Objective: Current vital signs: BP 137/81  Pulse 70  Temp 98.3 F (36.8 C) (Oral)  Resp 18  Ht 5\' 1"  (1.549 m)  Wt 83.28 kg (183 lb 9.6 oz)  BMI 34.69 kg/m2  SpO2 97%  LMP 02/11/2011  Neurologic Exam: Patient was asleep when I enter the room and she had to be awakened. She appear to be in no acute distress. Mental status was normal.  Medications:  Scheduled:   . aspirin EC  325 mg Oral Daily  . enoxaparin (LOVENOX) injection  40 mg Subcutaneous Q24H  . insulin aspart  0-5 Units Subcutaneous QHS  . insulin aspart  0-9 Units Subcutaneous TID WC  . methocarbamol (ROBAXIN) IV  500 mg Intravenous Q8H  . metoprolol succinate  12.5 mg Oral Daily  . sodium chloride  3 mL Intravenous Q12H  . sodium chloride  3 mL Intravenous Q12H  . [COMPLETED] valproate sodium  500 mg Intravenous Once   ZOX:WRUEAV chloride, acetaminophen, acetaminophen, alum & mag hydroxide-simeth, hydrALAZINE, meclizine, ondansetron (ZOFRAN) IV, ondansetron, oxyCODONE, sodium chloride, zolpidem, [DISCONTINUED]  HYDROmorphone (DILAUDID) injection, [DISCONTINUED] oxyCODONE  Assessment/Plan: Continue headache, most likely mixed type with vascular component as well as muscle contraction (tension) headache.  I will give her a trial of IV morphine, one-time dose today as well as continue Robaxin. I will also give her a trial of Naprosyn 500 mg twice a day.  C.R. Roseanne Reno, MD [facility] [phone]  10/29/2012  9:03 AM

## 2012-10-30 LAB — GLUCOSE, CAPILLARY
Glucose-Capillary: 135 mg/dL — ABNORMAL HIGH (ref 70–99)
Glucose-Capillary: 144 mg/dL — ABNORMAL HIGH (ref 70–99)

## 2012-10-30 MED ORDER — TRAMADOL HCL 50 MG PO TABS: 50.0000 mg | ORAL_TABLET | Freq: Three times a day (TID) | ORAL | Status: DC | PRN

## 2012-10-30 MED ORDER — METOPROLOL SUCCINATE 12.5 MG HALF TABLET: 12.5000 mg | ORAL_TABLET | Freq: Every day | ORAL | Status: DC

## 2012-10-30 MED ORDER — CARBAMAZEPINE 200 MG PO TABS
100.0000 mg | ORAL_TABLET | Freq: Two times a day (BID) | ORAL | Status: DC
  Administered 2012-10-30: 100 mg via ORAL
  Filled 2012-10-30 (×2): qty 0.5

## 2012-10-30 MED ORDER — ONDANSETRON HCL 4 MG PO TABS: 4.0000 mg | ORAL_TABLET | Freq: Four times a day (QID) | ORAL | Status: DC | PRN

## 2012-10-30 MED ORDER — CARBAMAZEPINE 200 MG PO TABS: 100.0000 mg | ORAL_TABLET | Freq: Two times a day (BID) | ORAL | Status: DC

## 2012-10-30 MED ORDER — ALUM & MAG HYDROXIDE-SIMETH 200-200-20 MG/5ML PO SUSP: 30.0000 mL | Freq: Four times a day (QID) | ORAL | Status: DC | PRN

## 2012-10-30 NOTE — Discharge Summary (Signed)
Patient seen and examined. Agree with note by Toya Smothers, NP. Patient here with headache. Has been seen by neurology and recommendations are to discontinue all narcotics and treat her headache with prophylactic tegretol and PRN tramadol. Prescriptions have been given. Case management is assisting with finding PCP and neurology OP follow up.  Peggye Pitt, MD [facility] Pager: [phone]

## 2012-10-30 NOTE — Discharge Summary (Signed)
Physician Discharge Summary  Brenda Ford WUJ:[id number] DOB: [birth year] DOA: 10/25/2012  PCP: Sheila Oats, MD  Admit date: 10/25/2012 Discharge date: 10/30/2012  Time spent: 40 minutes minutes  Recommendations for Outpatient Follow-up:  1. Pt will need follow up in 1-2 weeks for tegretol level. Pt has no PCP and has been provided with information for [facility].   Discharge Diagnoses:  Principal Problem:  *Vertigo Active Problems:  Cerebrovascular disease  Hypertension  Diabetes mellitus  Hyperlipidemia  Pain of left heel   Discharge Condition: Medically stable and ready for discharge to home  Diet recommendation: regular  Filed Weights   10/26/12 0615  Weight: 83.28 kg (183 lb 9.6 oz)    History of present illness:  Brenda Ford is a 51 y.o. female with pmhx including a previous CVA, DM, HTN, obesity who presented to the ED on 10/26/12 with complaints of severe dizziness and lightheadedness X 3 weeks. She reported having a dull headache as well. She denied any fever chills or nausea, chest pain or syncope. She did report feeling as if she would pass out. In the ED she was evaluated and neurology was consulted and saw her in the ED. An MRI of the Brain was performed to evaluate for a posterior infarct and showed no acute infarct. She also has complained of left heel pain X 3 weeks and she denied any history of trauma, and has no prior history of gout.      Hospital Course:  1. Vertigo/HA: pt admitted and neuro consulted. Given Depakote and vertigo improved.  At discharge pt denied dizziness. 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 Headache. Initially given morphine, naproxen and robaxin with fair relief. At discharge neuro recommends discontinuing narcotics and managing headache with Tegretol for prophylactic therapy and Ultram for abortive therapy. Also recommend follow up in 1-2 weeks for tegretol level.  3.  Pain of left heel  - X ray shows calcaneal spurs  - patient will need follow up with pcp and or ortho as outpatient for further evaluation or recommendations.   4. DM  - uncontrolled with HgA1c 8.6. During this hospitalization sugars ranged from 145-161. Used SSI for glycemic control. Placed on diabetic diet . Discussed with pt need for glucose control. Will need OP follow up for optimal control.    5. HTN  - started on low dose metoprolol succinate. Fair control. At discharge Bp 147/86. Will need OP follow up for optimal control. Educated on heart healthy diet.       Procedures:  none  Consultations:  Neuro  Discharge Exam: Filed Vitals:   10/30/12 0404 10/30/12 0407 10/30/12 0750 10/30/12 0944  BP: 154/97 171/90 168/84 147/86  Pulse: 64 63 65 68  Temp:      TempSrc:      Resp:   18   Height:      Weight:      SpO2:   99%     General: awake alert oriented x3 NAD Cardiovascular: RRR No MGR No LEE PPP Respiratory: normal effort BSCTAB No wheeze/rhonchi  Discharge Instructions      Discharge Orders    Future Orders Please Complete By Expires   Diet - low sodium heart healthy      Increase activity slowly      Discharge instructions      Comments:   Recommend follow up with neurology in 1-2 week for tegratol   Call MD for:  persistant  nausea and vomiting      Call MD for:  persistant dizziness or light-headedness      Increase activity slowly      Call MD for:  difficulty breathing, headache or visual disturbances      Call MD for:  persistant dizziness or light-headedness          Medication List     As of 10/30/2012 10:53 AM    TAKE these medications         alum & mag hydroxide-simeth 200-200-20 MG/5ML suspension   Commonly known as: MAALOX/MYLANTA   Take 30 mLs by mouth every 6 (six) hours as needed (dyspepsia).      carbamazepine 200 MG tablet   Commonly known as: TEGRETOL   Take 0.5 tablets (100 mg total) by mouth 2 (two) times daily.       ibuprofen 200 MG tablet   Commonly known as: ADVIL,MOTRIN   Take 400 mg by mouth every 6 (six) hours as needed. For pain      metoprolol succinate 12.5 mg Tb24   Commonly known as: TOPROL-XL   Take 0.5 tablets (12.5 mg total) by mouth daily.      ondansetron 4 MG tablet   Commonly known as: ZOFRAN   Take 1 tablet (4 mg total) by mouth every 6 (six) hours as needed for nausea.      traMADol 50 MG tablet   Commonly known as: ULTRAM   Take 1 tablet (50 mg total) by mouth every 8 (eight) hours as needed.         Follow-up Information    Follow up with [facility] information provided to patient. will need appointment in 1-2 weeks. Will need lab work for tegretol level. . Call in 1 week.          The results of significant diagnostics from this hospitalization (including imaging, microbiology, ancillary and laboratory) are listed below for reference.    Significant Diagnostic Studies: Ct Angio Head W/cm &/or Wo Cm  10/26/2012  *RADIOLOGY REPORT*  Clinical Data:   EVALUATE POSTERIOR FOSSA CIRCULATION FOR POSSIBLE DISSECTION OR STENOSIS.  HEADACHE AND DIZZINESS. DIABETIC HYPERTENSIVE PATIENT WITH HYPERLIPIDEMIA.  CT ANGIOGRAPHY HEAD AND NECK  Technique:  Multidetector CT imaging of the head and neck was performed using the standard protocol during bolus administration of intravenous contrast.  Multiplanar CT image reconstructions including MIPs were obtained to evaluate the vascular anatomy. Carotid stenosis measurements (when applicable) are obtained utilizing NASCET criteria, using the distal internal carotid diameter as the denominator.  Contrast: OMNIPAQUE IOHEXOL 350 MG/ML SOLN  Comparison:  10/25/2012 MR brain, MR angiogram circle of Willis and MR angiogram of the neck.  CTA NECK  Findings:  Heterogeneous appearance of the thyroid gland with dominant right isthmus 9.2 x 4.4 mm nodule.  This can be followed with thyroid ultrasound.  Scattered increased number of normal to top  normal size neck lymph nodes.  This includes a left level I 1.2 x 0.9 x 0.9 cm lymph node. Grouping of left level II lymph nodes with short axis dimension less than 1 cm.  Significance indeterminate.  Mild prominence of the palatine tonsils with minimal asymmetry. Direct visualization can be performed to exclude mucosal abnormality.  Visualized lung apices clear.  Mild cervical spondylotic changes.  Significant dental caries.  Common origin innominate artery left common carotid artery. Ectatic proximal great vessels.  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  Reconstitution of flow at the C1 level.  Artifact extends through the right subclavian artery.  Dominant right vertebral artery without significant stenosis.  Carotid bifurcation without hemodynamically significant stenosis. Tortuosity of the distal vertical cervical segment of the internal carotid arteries bilaterally greater on the left without evidence of fibromuscular dysplasia.   Review of the MIP images confirms the above findings.  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  Reconstitution of flow at the C1 level.  No hemodynamically significant stenosis of the right vertebral artery or carotid bifurcation. Ectatic carotid arteries greater on the left.  Ectatic origin of the great vessels.  Increased number of normal to top normal sized lymph nodes as discussed above  of questionable significance.  Slight prominence palatine tonsils.  This may be normal for this patient.  Direct visualization would be necessary to exclude mucosal abnormality.  Heterogeneous appearance of the thyroid gland with dominant right isthmus 9.2 x 4.4 mm nodule.  This can be followed with thyroid ultrasound.  Significant dental caries.  CTA HEAD  Findings:  In this patient with occluded cervical segment of the left vertebral artery which has reconstitution of flow at the C1 level, the portion of the left vertebral artery which is visualized is  markedly diminutive in size.  Tiny irregular left PICA.  Remote left inferior cerebellar infarct.  Ectatic right vertebral artery and basilar artery.  Small slightly irregular right PICA.  Small left AICA.  Mild ectasia of the cavernous segment of the internal carotid artery without significant narrowing.  Small irregular A1 segment left anterior cerebral artery.  Dominant A2 segment right anterior cerebral artery approaching an azygos configuration.  No aneurysm noted.  Middle cerebral artery branch vessel irregularity.  No intracranial enhancing mass or findings of acute infarct.  Mild exophthalmos.   Review of the MIP images confirms the above findings.  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  The intracranial portion of the left vertebral artery which is visualized is markedly diminutive in size.  Tiny irregular left PICA.  Remote inferior left cerebellar infarct.  Please see above.  Preliminary report at the time imaging by Dr. Caryl Pina.   Original Report Authenticated By: Lacy Duverney, M.D.    Dg Os Calcis Left  10/26/2012  *RADIOLOGY REPORT*  Clinical Data: Left heel and plantar region pain.  LEFT OS CALCIS - 2+ VIEW  Comparison: None.  Findings: No fracture or bone lesion.  There are small plantar and dorsal calcaneal spurs.  The soft tissues are unremarkable.  IMPRESSION: Calcaneal spurs.  No other abnormality.   Original Report Authenticated By: Amie Portland, M.D.    Ct Head Wo Contrast  10/25/2012  *RADIOLOGY REPORT*  Clinical Data: Headache and dizziness.  CT HEAD WITHOUT CONTRAST  Technique:  Contiguous axial images were obtained from the base of the skull through the vertex without contrast.  Comparison: MRI performed earlier.  Findings: Remote left cerebellar infarct.  No acute stroke or bleed.  No mass lesions, hydrocephalus, or extra-axial fluid. Slight premature atrophy.  Chronic microvascular ischemic change.  Calvarium intact.  Clear sinuses and mastoids.  IMPRESSION: Chronic left cerebellar infarct.  No acute intracranial findings.   Original Report Authenticated By: Davonna Belling, M.D.    Ct Angio Neck W/cm &/or Wo/cm  10/26/2012  *RADIOLOGY REPORT*  Clinical Data:   EVALUATE POSTERIOR FOSSA CIRCULATION FOR POSSIBLE DISSECTION OR STENOSIS.  HEADACHE AND DIZZINESS. DIABETIC HYPERTENSIVE PATIENT WITH HYPERLIPIDEMIA.  CT ANGIOGRAPHY HEAD AND NECK  Technique:  Multidetector CT imaging of the head and neck was performed using the standard protocol during bolus administration of intravenous contrast.  Multiplanar CT image reconstructions including MIPs were obtained to evaluate the vascular anatomy. Carotid stenosis measurements (when applicable) are obtained utilizing NASCET criteria, using the distal internal carotid diameter as the denominator.  Contrast: OMNIPAQUE IOHEXOL 350 MG/ML SOLN  Comparison:  10/25/2012 MR brain, MR angiogram circle of Willis and MR angiogram of the neck.  CTA NECK  Findings:  Heterogeneous appearance of the thyroid gland with dominant right isthmus 9.2 x 4.4 mm nodule.  This can be followed with thyroid ultrasound.  Scattered increased number of normal to top normal size neck lymph nodes.  This includes a left level I 1.2 x 0.9 x 0.9 cm lymph node. Grouping of left level II lymph nodes with short axis dimension less than 1 cm.  Significance indeterminate.  Mild prominence of the palatine tonsils with minimal asymmetry. Direct visualization can be performed to exclude mucosal abnormality.  Visualized lung apices clear.  Mild cervical spondylotic changes.  Significant dental caries.  Common origin innominate artery left common carotid artery. Ectatic proximal great vessels.  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  Reconstitution of flow at the C1 level.  Artifact extends through the right subclavian artery.  Dominant right vertebral artery without significant stenosis.  Carotid  bifurcation without hemodynamically significant stenosis. Tortuosity of the distal vertical cervical segment of the internal carotid arteries bilaterally greater on the left without evidence of fibromuscular dysplasia.   Review of the MIP images confirms the above findings.  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  Reconstitution of flow at the C1 level.  No hemodynamically significant stenosis of the right vertebral artery or carotid bifurcation. Ectatic carotid arteries greater on the left.  Ectatic origin of the great vessels.  Increased number of normal to top normal sized lymph nodes as discussed above  of questionable significance.  Slight prominence palatine tonsils.  This may be normal for this patient.  Direct visualization would be necessary to exclude mucosal abnormality.  Heterogeneous appearance of the thyroid gland with dominant right isthmus 9.2 x 4.4 mm nodule.  This can be followed with thyroid ultrasound.  Significant dental caries.  CTA HEAD  Findings:  In this patient with occluded cervical segment of the left vertebral artery which has reconstitution of flow at the C1 level, the portion of the left vertebral artery which is visualized is markedly diminutive in size.  Tiny irregular left PICA.  Remote left inferior cerebellar infarct.  Ectatic right vertebral artery and basilar artery.  Small slightly irregular right PICA.  Small left AICA.  Mild ectasia of the cavernous segment of the internal carotid artery without significant narrowing.  Small irregular A1 segment left anterior cerebral artery.  Dominant A2 segment right anterior cerebral artery approaching an azygos configuration.  No aneurysm noted.  Middle cerebral artery branch vessel irregularity.  No intracranial enhancing mass or findings of acute infarct.  Mild exophthalmos.   Review of the MIP images confirms the above findings.  IMPRESSION: Occluded cervical segment of the left vertebral artery with  reconstitution of flow at the C1 level.  The intracranial portion of the left vertebral artery which is visualized is markedly diminutive in size.  Tiny irregular left PICA.  Remote inferior left cerebellar infarct.  Please see above.  Preliminary report at the time imaging by Dr. Caryl Pina.   Original Report Authenticated By: Lacy Duverney,  M.D.    Mr Maxine Glenn Head Wo Contrast  10/25/2012  *RADIOLOGY REPORT*  Clinical Data:    Dizziness, headache. History of hypertension. History of diabetes.  Noncompliant medications.  History of previous stroke.  MRI HEAD WITHOUT AND WITH CONTRAST MRA HEAD WITHOUT CONTRAST MRA NECK WITHOUT AND WITH CONTRAST  Technique: Multiplanar, multiecho pulse sequences of the brain and surrounding structures were obtained according to standard protocol without and with intravenous contrast.  Angiographic images of the Circle of Willis were obtained using MRA technique without intravenous contrast.  Angiographic images of the neck were obtained using MRA technique without and with intravenous contrast.  Contrast: MultiHance 15 ml.  Comparison: CT head 03/09/2011.  MRI HEAD WITHOUT AND WITH CONTRAST  Findings:  Remote PICA territory inferior cerebellar infarct on the left.   No acute stroke or bleed.  Mild premature atrophy.  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 Post infusion, no abnormal intracranial enhancement.  The visualized calvarium and skull base are intact. Negative orbits, sinuses and mastoids.  No pituitary or cerebellar tonsillar abnormality.  Upper cervical region unremarkable. Compared with 2012, the chronic infarct was present at that time.  IMPRESSION: Remote left cerebellar infarct.  No evidence for acute infarction. Premature atrophy and chronic microvascular ischemic change.  MRA HEAD WITHOUT CONTRAST  Findings:  Patent internal carotid arteries but  dolichoectatic consistent with history of hypertension.  Also dolichoectatic basilar artery.  Right vertebral is the sole contributor to the basilar.  Left vertebral does not fill antegrade but appears to reconstitute over a 5 mm segment with slight retrograde flow. There is no proximal stenosis of the anterior, middle, there is no proximal stenosis of the middle cerebral arteries although mild nonstenotic irregularity can be seen bilaterally. Distal MCA branches show irregularity but are patent.  The right A1 ACA is the dominant contributor to the anterior cerebral arteries.  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  The proximal anterior cerebral artery distal to the anterior communicating appears azygos.  Mild nonstenotic irregularity can be seen in the ambient segment the right PCA.  Left PCA appears unremarkable.  No cerebellar branch occlusion except for left PICA which is not seen.  IMPRESSION: Intracranial atherosclerotic changes as described. 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    Left vertebral in the neck and skull base not patent with only a short segment reconstitution distally. Nonfilling left PICA.   MRA NECK WITHOUT AND WITH CONTRAST  Findings:  Bovine origin.  Dolichoectatic proximal vasculature without focal stenosis.  Nonstenotic Right carotid bifurcation.   Tortuous cervical ICA without dissection or fibromuscular change.  Nonstenotic left carotid bifurcation.  Moderately tortuous cervical ICA without dissection or muscular change.  Right vertebral widely patent throughout its course without ostial stenosis.  Left vertebral is small and  is occluded 2 cm from its origin.  There may be faint  reconstitution distally at the skull base, but there are multiple segmental areas of irregularity.  IMPRESSION: No extracranial carotid disease.  Right vertebral dominant and sole contributor to the basilar.  Severe disease of the left  vertebral.  There is no continuous vessel from its origin to the intracranial compartment.  Most of this vessel is occluded.   Original Report Authenticated By: Davonna Belling, M.D.    Mr Angiogram Neck W Wo Contrast  10/25/2012  *RADIOLOGY REPORT*  Clinical Data:  Dizziness, headache. History of hypertension. History of diabetes.  Noncompliant medications.  History of previous stroke.  MRI HEAD WITHOUT AND WITH CONTRAST MRA HEAD WITHOUT CONTRAST MRA NECK WITHOUT AND WITH CONTRAST  Technique: Multiplanar, multiecho pulse sequences of the brain and surrounding structures were obtained according to standard protocol without and with intravenous contrast.  Angiographic images of the Circle of Willis were obtained using MRA technique without intravenous contrast.  Angiographic images of the neck were obtained using MRA technique without and with intravenous contrast.  Contrast: MultiHance 15 ml.  Comparison: CT head 03/09/2011.  MRI HEAD WITHOUT AND WITH CONTRAST  Findings:  Remote PICA territory inferior cerebellar infarct on the left.   No acute stroke or bleed.  Mild premature atrophy.  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 Post infusion, no abnormal intracranial enhancement.  The visualized calvarium and skull base are intact. Negative orbits, sinuses and mastoids.  No pituitary or cerebellar tonsillar abnormality.  Upper cervical region unremarkable. Compared with 2012, the chronic infarct was present at that time.  IMPRESSION: Remote left cerebellar infarct.  No evidence for acute infarction. Premature atrophy and chronic microvascular ischemic change.  MRA HEAD WITHOUT CONTRAST  Findings:  Patent internal carotid arteries but dolichoectatic consistent with history of hypertension.  Also dolichoectatic basilar artery.  Right vertebral is the sole contributor to the basilar.  Left vertebral  does not fill antegrade but appears to reconstitute over a 5 mm segment with slight retrograde flow. There is no proximal stenosis of the anterior, middle, there is no proximal stenosis of the middle cerebral arteries although mild nonstenotic irregularity can be seen bilaterally. Distal MCA branches show irregularity but are patent.  The right A1 ACA is the dominant contributor to the anterior cerebral arteries.  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  The proximal anterior cerebral artery distal to the anterior communicating appears azygos.  Mild nonstenotic irregularity can be seen in the ambient segment the right PCA.  Left PCA appears unremarkable.  No cerebellar branch occlusion except for left PICA which is not seen.  IMPRESSION: Intracranial atherosclerotic changes as described. 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    Left vertebral in the neck and skull base not patent with only a short segment reconstitution distally. Nonfilling left PICA.   MRA NECK WITHOUT AND WITH CONTRAST  Findings:  Bovine origin.  Dolichoectatic proximal vasculature without focal stenosis.  Nonstenotic Right carotid bifurcation.   Tortuous cervical ICA without dissection or fibromuscular change.  Nonstenotic left carotid bifurcation.  Moderately tortuous cervical ICA without dissection or muscular change.  Right vertebral widely patent throughout its course without ostial stenosis.  Left vertebral is small and  is occluded 2 cm from its origin.  There may be faint  reconstitution distally at the skull base, but there are multiple segmental areas of irregularity.  IMPRESSION: No extracranial carotid disease.  Right vertebral dominant and sole contributor to the basilar.  Severe disease of the left vertebral.  There is no continuous vessel from its origin to the intracranial compartment.  Most of this vessel is occluded.   Original Report Authenticated By: Davonna Belling, M.D.    Mr Laqueta Jean Wo Contrast  10/25/2012  *RADIOLOGY REPORT*  Clinical Data:    Dizziness, headache. History of hypertension. History of diabetes.  Noncompliant medications.  History of previous stroke.  MRI HEAD  WITHOUT AND WITH CONTRAST MRA HEAD WITHOUT CONTRAST MRA NECK WITHOUT AND WITH CONTRAST  Technique: Multiplanar, multiecho pulse sequences of the brain and surrounding structures were obtained according to standard protocol without and with intravenous contrast.  Angiographic images of the Circle of Willis were obtained using MRA technique without intravenous contrast.  Angiographic images of the neck were obtained using MRA technique without and with intravenous contrast.  Contrast: MultiHance 15 ml.  Comparison: CT head 03/09/2011.  MRI HEAD WITHOUT AND WITH CONTRAST  Findings:  Remote PICA territory inferior cerebellar infarct on the left.   No acute stroke or bleed.  Mild premature atrophy.  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 Post infusion, no abnormal intracranial enhancement.  The visualized calvarium and skull base are intact. Negative orbits, sinuses and mastoids.  No pituitary or cerebellar tonsillar abnormality.  Upper cervical region unremarkable. Compared with 2012, the chronic infarct was present at that time.  IMPRESSION: Remote left cerebellar infarct.  No evidence for acute infarction. Premature atrophy and chronic microvascular ischemic change.  MRA HEAD WITHOUT CONTRAST  Findings:  Patent internal carotid arteries but dolichoectatic consistent with history of hypertension.  Also dolichoectatic basilar artery.  Right vertebral is the sole contributor to the basilar.  Left vertebral does not fill antegrade but appears to reconstitute over a 5 mm segment with slight retrograde flow. There is no proximal stenosis of the anterior, middle, there is no proximal  stenosis of the middle cerebral arteries although mild nonstenotic irregularity can be seen bilaterally. Distal MCA branches show irregularity but are patent.  The right A1 ACA is the dominant contributor to the anterior cerebral arteries.  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  The proximal anterior cerebral artery distal to the anterior communicating appears azygos.  Mild nonstenotic irregularity can be seen in the ambient segment the right PCA.  Left PCA appears unremarkable.  No cerebellar branch occlusion except for left PICA which is not seen.  IMPRESSION: Intracranial atherosclerotic changes as described. 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    Left vertebral in the neck and skull base not patent with only a short segment reconstitution distally. Nonfilling left PICA.   MRA NECK WITHOUT AND WITH CONTRAST  Findings:  Bovine origin.  Dolichoectatic proximal vasculature without focal stenosis.  Nonstenotic Right carotid bifurcation.   Tortuous cervical ICA without dissection or fibromuscular change.  Nonstenotic left carotid bifurcation.  Moderately tortuous cervical ICA without dissection or muscular change.  Right vertebral widely patent throughout its course without ostial stenosis.  Left vertebral is small and  is occluded 2 cm from its origin.  There may be faint  reconstitution distally at the skull base, but there are multiple segmental areas of irregularity.  IMPRESSION: No extracranial carotid disease.  Right vertebral dominant and sole contributor to the basilar.  Severe disease of the left vertebral.  There is no continuous vessel from its origin to the intracranial compartment.  Most of this vessel is occluded.   Original Report Authenticated By: Davonna Belling, M.D.     Microbiology: No results found for this or any previous visit (from the past 240 hour(s)).   Labs: Basic Metabolic Panel:  Lab 10/27/12 4742 10/25/12 1834   NA 135 137  K 3.6 3.8  CL 97 98  CO2 27 29  GLUCOSE 149* 138*  BUN 16 11  CREATININE  0.89 0.89  CALCIUM 9.7 10.5  MG -- --  PHOS -- --   Liver Function Tests:  Lab 10/25/12 1834  AST 26  ALT 25  ALKPHOS 121*  BILITOT 0.5  PROT 9.0*  ALBUMIN 4.2   No results found for this basename: LIPASE:5,AMYLASE:5 in the last 168 hours No results found for this basename: AMMONIA:5 in the last 168 hours CBC:  Lab 10/27/12 0555 10/25/12 1834  WBC 7.6 8.5  NEUTROABS -- 5.4  HGB 11.6* 13.0  HCT 35.9* 39.2  MCV 75.9* 76.1*  PLT 283 325   Cardiac Enzymes:  Lab 10/25/12 2021  CKTOTAL --  CKMB --  CKMBINDEX --  TROPONINI <0.30   BNP: BNP (last 3 results) No results found for this basename: PROBNP:3 in the last 8760 hours CBG:  Lab 10/30/12 0731 10/29/12 2014 10/29/12 1641 10/29/12 1118 10/29/12 0746  GLUCAP 135* 152* 161* 160* 150*       Signed:  Gwenyth Bender NP [facility] 10/30/2012, 10:53 AM

## 2012-10-30 NOTE — Progress Notes (Signed)
Pt provided with dc instructions and education. Pt verbalized understanding. Pt aware of follow up appopintments to be made. Pt provided with prescriptions and is aware of how to take all new medications. IV removed with tip intact. Heart monitor cleaned and returned to front. Pt is waiting for ride to dc home. VSS. [place] Masaki Katheren Puller, RN

## 2012-10-30 NOTE — Progress Notes (Signed)
Subjective: Patient in room resting with the lights off.  When awakened reports that she still has a headache and rates it at 8/10.  Reports that the headache is moving around but is mostly over the right side.  Work up has been unremarkable including an ESR of 36.    Objective: Current vital signs: BP 147/86  Pulse 68  Temp 97.4 F (36.3 C) (Oral)  Resp 18  Ht 5\' 1"  (1.549 m)  Wt 83.28 kg (183 lb 9.6 oz)  BMI 34.69 kg/m2  SpO2 99%  LMP 02/11/2011 Vital signs in last 24 hours: Temp:  [97.4 F (36.3 C)-98.6 F (37 C)] 97.4 F (36.3 C) (11/20 0400) Pulse Rate:  [56-68] 68  (11/20 0944) Resp:  [16-18] 18  (11/20 0750) BP: (137-171)/(84-99) 147/86 mmHg (11/20 0944) SpO2:  [97 %-99 %] 99 % (11/20 0750)  Intake/Output from previous day: 11/19 0701 - 11/20 0700 In: 966 [P.O.:960; I.V.:6] Out: -  Intake/Output this shift: Total I/O In: 483 [P.O.:480; I.V.:3] Out: -  Nutritional status: Carb Control  Neurologic Exam: Mental Status: Alert, oriented, thought content appropriate.  Speech fluent without evidence of aphasia.  Able to follow 3 step commands without difficulty. Cranial Nerves: II: Visual fields grossly normal, pupils equal, round, reactive to light and accommodation III,IV, VI: ptosis not present, extra-ocular motions intact bilaterally V,VII: smile symmetric, facial light touch sensation normal bilaterally VIII: hearing normal bilaterally IX,X: gag reflex present XI: bilateral shoulder shrug XII: midline tongue extension Motor: Right : Upper extremity   5/5    Left:     Upper extremity   5/5  Lower extremity   5/5     Lower extremity   5/5 Tone and bulk:normal tone throughout; no atrophy noted Sensory: Pinprick and light touch intact throughout, bilaterally Deep Tendon Reflexes: 2+ and symmetric throughout Plantars: Right: downgoing   Left: downgoing Cerebellar: normal finger-to-nose and normal heel-to-shin test   Lab Results: Basic Metabolic Panel:  Lab  10/27/12 0555 10/25/12 1834  NA 135 137  K 3.6 3.8  CL 97 98  CO2 27 29  GLUCOSE 149* 138*  BUN 16 11  CREATININE 0.89 0.89  CALCIUM 9.7 10.5  MG -- --  PHOS -- --    Liver Function Tests:  Lab 10/25/12 1834  AST 26  ALT 25  ALKPHOS 121*  BILITOT 0.5  PROT 9.0*  ALBUMIN 4.2   No results found for this basename: LIPASE:5,AMYLASE:5 in the last 168 hours No results found for this basename: AMMONIA:3 in the last 168 hours  CBC:  Lab 10/27/12 0555 10/25/12 1834  WBC 7.6 8.5  NEUTROABS -- 5.4  HGB 11.6* 13.0  HCT 35.9* 39.2  MCV 75.9* 76.1*  PLT 283 325    Cardiac Enzymes:  Lab 10/25/12 2021  CKTOTAL --  CKMB --  CKMBINDEX --  TROPONINI <0.30    Lipid Panel:  Lab 10/26/12 0826  CHOL 199  TRIG 126  HDL 69  CHOLHDL 2.9  VLDL 25  LDLCALC 161*    CBG:  Lab 10/29/12 2014 10/29/12 1641 10/29/12 1118 10/29/12 0746 10/28/12 2137  GLUCAP 152* 161* 160* 150* 152*    Microbiology:   Coagulation Studies: No results found for this basename: LABPROT:5,INR:5 in the last 72 hours  Imaging: No results found.  Medications:  I have reviewed the patient's current medications. Prior to Admission:  Prescriptions prior to admission  Medication Sig Dispense Refill  . ibuprofen (ADVIL,MOTRIN) 200 MG tablet Take 400 mg by mouth every  6 (six) hours as needed. For pain       Scheduled:   . ALPRAZolam  0.5 mg Oral TID  . aspirin EC  325 mg Oral Daily  . enoxaparin (LOVENOX) injection  40 mg Subcutaneous Q24H  . insulin aspart  0-5 Units Subcutaneous QHS  . insulin aspart  0-9 Units Subcutaneous TID WC  . metoprolol succinate  12.5 mg Oral Daily  . [COMPLETED]  morphine injection  2 mg Intravenous Once  . naproxen  500 mg Oral BID WC  . sodium chloride  3 mL Intravenous Q12H  . sodium chloride  3 mL Intravenous Q12H  . [DISCONTINUED] methocarbamol (ROBAXIN) IV  500 mg Intravenous Q8H    Assessment/Plan:  Patient Active Hospital Problem List: Vertigo  (10/26/2012)   Assessment: Improved since the administration of Depakote.  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    Plan:  1.  No further intervention recommended at that time.    Headache (10/26/2012)   Assessment: Headache continues.  Requires OxyIR despite Naproxen twice a day.     Plan:  1.  Would stop all narcotics including OxyIR since does not seem to be making a significant improvement in pain.    2.  Discontinue Naproxen.  Again no improvement in pain.    3.  Would start Ultram 50mg  q8hrs prn headache for abortive therapy  4.  For prophylactic therapy would consider Tegretol 100mg  BID  5.  Patient to follow up with neurology at discharge.  Will need follow up in 1-2 weeks to follow up Tegretol level.       LOS: 5 days   Thana Farr, MD [facility] [phone] 10/30/2012  9:59 AM

## 2013-02-03 ENCOUNTER — Emergency Department (HOSPITAL_COMMUNITY): Payer: Medicare (Managed Care)

## 2013-02-03 ENCOUNTER — Emergency Department (HOSPITAL_COMMUNITY)
Admission: EM | Admit: 2013-02-03 | Discharge: 2013-02-03 | Disposition: A | Discharge status: Home / Self Care | Payer: Medicare (Managed Care) | Attending: Emergency Medicine | Admitting: Emergency Medicine

## 2013-02-03 ENCOUNTER — Ambulatory Visit (HOSPITAL_COMMUNITY)
Admission: RE | Admit: 2013-02-03 | Discharge: 2013-02-03 | Disposition: A | Discharge status: Home / Self Care | Payer: Medicare (Managed Care) | Source: Ambulatory Visit | Attending: Emergency Medicine | Admitting: Emergency Medicine

## 2013-02-03 ENCOUNTER — Encounter (HOSPITAL_COMMUNITY): Payer: Self-pay

## 2013-02-03 DIAGNOSIS — Z8673 Personal history of transient ischemic attack (TIA), and cerebral infarction without residual deficits: Secondary | ICD-10-CM | POA: Insufficient documentation

## 2013-02-03 DIAGNOSIS — E119 Type 2 diabetes mellitus without complications: Secondary | ICD-10-CM | POA: Insufficient documentation

## 2013-02-03 DIAGNOSIS — M79609 Pain in unspecified limb: Secondary | ICD-10-CM | POA: Insufficient documentation

## 2013-02-03 DIAGNOSIS — E669 Obesity, unspecified: Secondary | ICD-10-CM | POA: Insufficient documentation

## 2013-02-03 DIAGNOSIS — Z7982 Long term (current) use of aspirin: Secondary | ICD-10-CM | POA: Insufficient documentation

## 2013-02-03 DIAGNOSIS — Z862 Personal history of diseases of the blood and blood-forming organs and certain disorders involving the immune mechanism: Secondary | ICD-10-CM | POA: Insufficient documentation

## 2013-02-03 DIAGNOSIS — Z8679 Personal history of other diseases of the circulatory system: Secondary | ICD-10-CM | POA: Insufficient documentation

## 2013-02-03 DIAGNOSIS — Z8639 Personal history of other endocrine, nutritional and metabolic disease: Secondary | ICD-10-CM | POA: Insufficient documentation

## 2013-02-03 DIAGNOSIS — M25569 Pain in unspecified knee: Secondary | ICD-10-CM

## 2013-02-03 DIAGNOSIS — I10 Essential (primary) hypertension: Secondary | ICD-10-CM | POA: Insufficient documentation

## 2013-02-03 LAB — CBC
HCT: 39.1 % (ref 36.0–46.0)
Hemoglobin: 13.3 g/dL (ref 12.0–15.0)
RDW: 13.3 % (ref 11.5–15.5)
WBC: 6.1 10*3/uL (ref 4.0–10.5)

## 2013-02-03 LAB — POCT I-STAT, CHEM 8
Chloride: 101 mEq/L (ref 96–112)
Creatinine, Ser: 0.8 mg/dL (ref 0.50–1.10)
Glucose, Bld: 248 mg/dL — ABNORMAL HIGH (ref 70–99)
HCT: 41 % (ref 36.0–46.0)
Potassium: 3.6 mEq/L (ref 3.5–5.1)
Sodium: 140 mEq/L (ref 135–145)

## 2013-02-03 MED ORDER — SODIUM CHLORIDE 0.9 % IV SOLN
INTRAVENOUS | Status: DC
  Administered 2013-02-03: 03:00:00 via INTRAVENOUS

## 2013-02-03 MED ORDER — METOPROLOL TARTRATE 50 MG PO TABS: 25.0000 mg | ORAL_TABLET | Freq: Two times a day (BID) | ORAL | Status: DC

## 2013-02-03 MED ORDER — HYDROMORPHONE HCL PF 1 MG/ML IJ SOLN
1.0000 mg | Freq: Once | INTRAMUSCULAR | Status: AC
  Administered 2013-02-03: 1 mg via INTRAVENOUS
  Filled 2013-02-03: qty 1

## 2013-02-03 MED ORDER — ONDANSETRON HCL 4 MG/2ML IJ SOLN
4.0000 mg | Freq: Once | INTRAMUSCULAR | Status: AC
  Administered 2013-02-03: 4 mg via INTRAVENOUS
  Filled 2013-02-03: qty 2

## 2013-02-03 MED ORDER — METOPROLOL TARTRATE 25 MG PO TABS
12.5000 mg | ORAL_TABLET | Freq: Once | ORAL | Status: AC
  Administered 2013-02-03: 12.5 mg via ORAL
  Filled 2013-02-03: qty 1

## 2013-02-03 MED ORDER — HYDROCODONE-ACETAMINOPHEN 5-325 MG PO TABS: 1.0000 | ORAL_TABLET | Freq: Four times a day (QID) | ORAL | Status: DC | PRN

## 2013-02-03 NOTE — ED Notes (Addendum)
Patient from home; cc: right knee pain x 1 week. Started while ambulating. Denies any trauma, injury or fall. Has increasingly gotten worse.  Incidentally, patient found to be hypertensive. Known hx HTN. Has been off medications since December. Per EMS, stroke screen negative. Patient does endorse blurred vision. Denies H/A.  Per EMS, initial BP 230/150. At arrival 200/120, manually.

## 2013-02-03 NOTE — ED Notes (Signed)
MD at bedside. 

## 2013-02-03 NOTE — Progress Notes (Signed)
*  PRELIMINARY RESULTS* Vascular Ultrasound Right lower extremity venous duplex has been completed.  Preliminary findings: Right= no evidence of DVT. Baker's cyst on the right.  Called report to Dr. Lynelle Doctor.   Farrel Demark, RDMS, RVT  02/03/2013, 9:00 AM

## 2013-02-03 NOTE — ED Notes (Signed)
IV attempt x 3 unsuccessful. Pt refused EJ by EDP. IV team phoned to assess for PIV.

## 2013-02-03 NOTE — ED Provider Notes (Signed)
History     CSN: [id number]  Arrival date & time 02/03/13  0025   First MD Initiated Contact with Patient 02/03/13 0047      Chief Complaint  Patient presents with  . Knee Pain  . Hypertension    (Consider location/radiation/quality/duration/timing/severity/associated sxs/prior treatment) HPI Hx per PT, R knee pain x 6 day, sore and hurts to walk, anterior, no trauma, worse with walking, no swelling. No F/C, no associated CP or SOB. H/o CVA, does not take HTN medications prescribed because " I ran out" and does not have a PCP.  PT has on and off HAs.  PT worried about her BP tonight. No weakness or numbness. PT admitted last Nov for HTN and HAs - no neck pain or stiffness. Knee pain MOD in severity, not radiating Past Medical History  Diagnosis Date  . Stroke   . Diabetes mellitus   . Hypertension   . Hyperlipidemia   . Migraine headache   . Obesity     Past Surgical History  Procedure Laterality Date  . Pilonidal cyst excision    . Cyst excision      Left Breast    Family History  Problem Relation Age of Onset  . Coronary artery disease Mother   . Diabetes type II Mother   . Hypertension Mother   . Cancer - Lung Father   . Cancer - Other Sister     Lymphoma    History  Substance Use Topics  . Smoking status: Never Smoker   . Smokeless tobacco: Never Used  . Alcohol Use: No    OB History   Grav Para Term Preterm Abortions TAB SAB Ect Mult Living                  Review of Systems  Constitutional: Negative for fever and chills.  HENT: Negative for neck pain and neck stiffness.   Eyes: Negative for pain.  Respiratory: Negative for shortness of breath.   Cardiovascular: Negative for chest pain.  Gastrointestinal: Negative for nausea, vomiting and abdominal pain.  Genitourinary: Negative for dysuria.  Musculoskeletal: Negative for back pain.  Skin: Negative for rash.  Neurological: Positive for headaches. Negative for dizziness, syncope and weakness.   All other systems reviewed and are negative.    Allergies  Review of patient's allergies indicates no known allergies.  Home Medications   Current Outpatient Rx  Name  Route  Sig  Dispense  Refill  . aspirin EC 81 MG tablet   Oral   Take 81 mg by mouth daily.         . carbamazepine (TEGRETOL) 200 MG tablet   Oral   Take 0.5 tablets (100 mg total) by mouth 2 (two) times daily.   60 tablet   0   . Ibuprofen-Diphenhydramine Cit (ADVIL PM PO)   Oral   Take 1 tablet by mouth every 6 (six) hours as needed (for pain).           BP 173/107  Temp(Src) 98.3 F (36.8 C) (Oral)  Resp 25  Ht 5\' 1"  (1.549 m)  Wt 180 lb (81.647 kg)  BMI 34.03 kg/m2  SpO2 96%  LMP 02/11/2011  Physical Exam  Constitutional: She is oriented to person, place, and time. She appears well-developed and well-nourished.  HENT:  Head: Normocephalic and atraumatic.  Eyes: Conjunctivae and EOM are normal. Pupils are equal, round, and reactive to light.  Neck: Full passive range of motion without pain. Neck supple. No thyromegaly  present.  No meningismus  Cardiovascular: Normal rate, regular rhythm, S1 normal, S2 normal and intact distal pulses.   Pulmonary/Chest: Effort normal and breath sounds normal.  Abdominal: Soft. Bowel sounds are normal. There is no tenderness. There is no CVA tenderness.  Musculoskeletal: Normal range of motion.  R knee TTP over patella with mild effusion. No erythema, cords, and neg Homan's sign. Distal N/V intact  Neurological: She is alert and oriented to person, place, and time. She has normal strength and normal reflexes. She displays normal reflexes. No cranial nerve deficit or sensory deficit. She exhibits normal muscle tone. She displays a negative Romberg sign. Coordination normal. GCS eye subscore is 4. GCS verbal subscore is 5. GCS motor subscore is 6.  Normal Gait  Skin: Skin is warm and dry. No rash noted. No cyanosis. Nails show no clubbing.  Psychiatric: She has  a normal mood and affect. Her speech is normal and behavior is normal.    ED Course  Procedures (including critical care time)  Results for orders placed during the hospital encounter of 02/03/13  CBC      Result Value Range   WBC 6.1  4.0 - 10.5 K/uL   RBC 5.25 (*) 3.87 - 5.11 MIL/uL   Hemoglobin 13.3  12.0 - 15.0 g/dL   HCT 16.1  09.6 - 04.5 %   MCV 74.5 (*) 78.0 - 100.0 fL   MCH 25.3 (*) 26.0 - 34.0 pg   MCHC 34.0  30.0 - 36.0 g/dL   RDW 40.9  81.1 - 91.4 %   Platelets 285  150 - 400 K/uL  POCT I-STAT, CHEM 8      Result Value Range   Sodium 140  135 - 145 mEq/L   Potassium 3.6  3.5 - 5.1 mEq/L   Chloride 101  96 - 112 mEq/L   BUN 7  6 - 23 mg/dL   Creatinine, Ser 7.82  0.50 - 1.10 mg/dL   Glucose, Bld 956 (*) 70 - 99 mg/dL   Calcium, Ion 2.13 (*) 1.12 - 1.23 mmol/L   TCO2 33  0 - 100 mmol/L   Hemoglobin 13.9  12.0 - 15.0 g/dL   HCT 08.6  57.8 - 46.9 %   Dg Knee Complete 4 Views Right  02/03/2013  *RADIOLOGY REPORT*  Clinical Data: Increasing anterior, medial, and posterior right knee pain over the past several days.  No reported injury.  RIGHT KNEE - COMPLETE 4+ VIEW  Comparison: None.  Findings: The right knee appears intact.  No evidence of acute fracture or subluxation.  No focal bone lesion or bone destruction. The bone cortex and trabecular architecture appear intact.  Focal benign-appearing sclerosis in the distal femoral metaphysis.  No significant effusion.  IMPRESSION: No acute bony abnormalities suggested.   Original Report Authenticated By: Burman Nieves, M.D.     Date: 02/03/2013  Rate: 72  Rhythm: normal sinus rhythm  QRS Axis: left  Intervals: normal  ST/T Wave abnormalities: nonspecific ST changes  Conduction Disutrbances:none  Narrative Interpretation:   Old EKG Reviewed: none available   IV Dilaudid Old records reviewed - Metoprolol provided per previous Rx 11/13 Pain improved, plan outpatient DVT study Plan: outpatient follow up HTN - PT will  call today to find PCP. Rx Metoprolol provided.   MDM   Knee pain - no trauma. No septic jt by exam. Plan [place]. No PE symptoms  HTN - improved with pain control. RX provided. Plan PCP follow up.  Sunnie Nielsen, MD 02/03/13 [phone]

## 2013-03-21 ENCOUNTER — Encounter (HOSPITAL_COMMUNITY): Payer: Self-pay | Admitting: Emergency Medicine

## 2013-03-21 ENCOUNTER — Emergency Department (HOSPITAL_COMMUNITY)
Admission: EM | Admit: 2013-03-21 | Discharge: 2013-03-21 | Disposition: A | Discharge status: Home / Self Care | Payer: PRIVATE HEALTH INSURANCE | Attending: Emergency Medicine | Admitting: Emergency Medicine

## 2013-03-21 DIAGNOSIS — R631 Polydipsia: Secondary | ICD-10-CM | POA: Insufficient documentation

## 2013-03-21 DIAGNOSIS — E669 Obesity, unspecified: Secondary | ICD-10-CM | POA: Insufficient documentation

## 2013-03-21 DIAGNOSIS — M25561 Pain in right knee: Secondary | ICD-10-CM

## 2013-03-21 DIAGNOSIS — Z8673 Personal history of transient ischemic attack (TIA), and cerebral infarction without residual deficits: Secondary | ICD-10-CM | POA: Insufficient documentation

## 2013-03-21 DIAGNOSIS — M25569 Pain in unspecified knee: Secondary | ICD-10-CM | POA: Insufficient documentation

## 2013-03-21 DIAGNOSIS — Z79899 Other long term (current) drug therapy: Secondary | ICD-10-CM | POA: Insufficient documentation

## 2013-03-21 DIAGNOSIS — Z862 Personal history of diseases of the blood and blood-forming organs and certain disorders involving the immune mechanism: Secondary | ICD-10-CM | POA: Insufficient documentation

## 2013-03-21 DIAGNOSIS — E1169 Type 2 diabetes mellitus with other specified complication: Secondary | ICD-10-CM | POA: Insufficient documentation

## 2013-03-21 DIAGNOSIS — G43909 Migraine, unspecified, not intractable, without status migrainosus: Secondary | ICD-10-CM | POA: Insufficient documentation

## 2013-03-21 DIAGNOSIS — R630 Anorexia: Secondary | ICD-10-CM | POA: Insufficient documentation

## 2013-03-21 DIAGNOSIS — I10 Essential (primary) hypertension: Secondary | ICD-10-CM | POA: Insufficient documentation

## 2013-03-21 DIAGNOSIS — H538 Other visual disturbances: Secondary | ICD-10-CM | POA: Insufficient documentation

## 2013-03-21 DIAGNOSIS — G8929 Other chronic pain: Secondary | ICD-10-CM | POA: Insufficient documentation

## 2013-03-21 DIAGNOSIS — R5381 Other malaise: Secondary | ICD-10-CM | POA: Insufficient documentation

## 2013-03-21 DIAGNOSIS — R3589 Other polyuria: Secondary | ICD-10-CM | POA: Insufficient documentation

## 2013-03-21 DIAGNOSIS — R739 Hyperglycemia, unspecified: Secondary | ICD-10-CM

## 2013-03-21 DIAGNOSIS — Z8639 Personal history of other endocrine, nutritional and metabolic disease: Secondary | ICD-10-CM | POA: Insufficient documentation

## 2013-03-21 DIAGNOSIS — R358 Other polyuria: Secondary | ICD-10-CM | POA: Insufficient documentation

## 2013-03-21 LAB — GLUCOSE, CAPILLARY: Glucose-Capillary: 134 mg/dL — ABNORMAL HIGH (ref 70–99)

## 2013-03-21 LAB — CBC WITH DIFFERENTIAL/PLATELET
Basophils Absolute: 0 10*3/uL (ref 0.0–0.1)
Basophils Relative: 0 % (ref 0–1)
HCT: 37.7 % (ref 36.0–46.0)
Lymphocytes Relative: 28 % (ref 12–46)
MCHC: 34 g/dL (ref 30.0–36.0)
Monocytes Absolute: 0.5 10*3/uL (ref 0.1–1.0)
Neutro Abs: 4.1 10*3/uL (ref 1.7–7.7)
Neutrophils Relative %: 64 % (ref 43–77)
Platelets: 265 10*3/uL (ref 150–400)
RDW: 13 % (ref 11.5–15.5)
WBC: 6.4 10*3/uL (ref 4.0–10.5)

## 2013-03-21 LAB — POCT I-STAT, CHEM 8
BUN: 7 mg/dL (ref 6–23)
Hemoglobin: 13.9 g/dL (ref 12.0–15.0)
Potassium: 3.8 mEq/L (ref 3.5–5.1)
Sodium: 140 mEq/L (ref 135–145)
TCO2: 29 mmol/L (ref 0–100)

## 2013-03-21 MED ORDER — METFORMIN HCL 500 MG PO TABS: 500.0000 mg | ORAL_TABLET | Freq: Two times a day (BID) | ORAL | Status: AC

## 2013-03-21 MED ORDER — INSULIN ASPART 100 UNIT/ML ~~LOC~~ SOLN
10.0000 [IU] | Freq: Once | SUBCUTANEOUS | Status: AC
  Administered 2013-03-21: 10 [IU] via INTRAVENOUS
  Filled 2013-03-21: qty 1

## 2013-03-21 MED ORDER — FENTANYL CITRATE 0.05 MG/ML IJ SOLN
50.0000 ug | Freq: Once | INTRAMUSCULAR | Status: AC
  Administered 2013-03-21: 50 ug via INTRAVENOUS
  Filled 2013-03-21: qty 2

## 2013-03-21 MED ORDER — METOPROLOL TARTRATE 50 MG PO TABS: 50.0000 mg | ORAL_TABLET | Freq: Two times a day (BID) | ORAL | Status: AC

## 2013-03-21 MED ORDER — HYDROCODONE-ACETAMINOPHEN 5-325 MG PO TABS: 1.0000 | ORAL_TABLET | Freq: Four times a day (QID) | ORAL | Status: AC | PRN

## 2013-03-21 MED ORDER — SODIUM CHLORIDE 0.9 % IV BOLUS (SEPSIS)
1000.0000 mL | Freq: Once | INTRAVENOUS | Status: AC
  Administered 2013-03-21: 1000 mL via INTRAVENOUS

## 2013-03-21 MED ORDER — METFORMIN HCL 500 MG PO TABS
500.0000 mg | ORAL_TABLET | Freq: Once | ORAL | Status: AC
  Administered 2013-03-21: 500 mg via ORAL
  Filled 2013-03-21: qty 1

## 2013-03-21 NOTE — ED Notes (Signed)
Pt  Brought to ED by EMS with multiple complaints-   Leg pain(rt),blurred vision and  feeling weak.

## 2013-03-21 NOTE — ED Notes (Signed)
Pt discharged.Vital signs stable and GCS 15 

## 2013-03-21 NOTE — ED Provider Notes (Addendum)
History     CSN: [id number]  Arrival date & time 03/21/13  1478   First MD Initiated Contact with Patient 03/21/13 [phone]      Chief Complaint  Patient presents with  . Knee Pain    (Consider location/radiation/quality/duration/timing/severity/associated sxs/prior treatment) HPI 52 year old female with about a 7 month history of right knee pain. She was seen here in February and had unremarkable plain films. A Doppler ultrasound was performed which ruled out a DVT but did show a Baker's cyst. She states the pain has been so severe she has been unable to get out of bed for the past several weeks, the pain being unbearable when she attempts to ambulate. There's been no acute change such as swelling or discoloration. She had a sister pass away about a week ago which has caused her to be a grief stricken and depressed and she believes this is exacerbating her symptoms. It has also caused her to have a poor appetite and poor sleep.  She has been diagnosed in the past of type 2 diabetes that has never been treated for it. She is also here with a complaint of blurred vision for the past 6 days accompanied by generalized weakness, polyuria and polydipsia.   Past Medical History  Diagnosis Date  . Stroke   . Diabetes mellitus   . Hypertension   . Hyperlipidemia   . Migraine headache   . Obesity     Past Surgical History  Procedure Laterality Date  . Pilonidal cyst excision    . Cyst excision      Left Breast    Family History  Problem Relation Age of Onset  . Coronary artery disease Mother   . Diabetes type II Mother   . Hypertension Mother   . Cancer - Lung Father   . Cancer - Other Sister     Lymphoma    History  Substance Use Topics  . Smoking status: Never Smoker   . Smokeless tobacco: Never Used  . Alcohol Use: No    OB History   Grav Para Term Preterm Abortions TAB SAB Ect Mult Living                  Review of Systems  All other systems reviewed and are  negative.    Allergies  Review of patient's allergies indicates no known allergies.  Home Medications   Current Outpatient Rx  Name  Route  Sig  Dispense  Refill  . HYDROcodone-acetaminophen (NORCO) 5-325 MG per tablet   Oral   Take 1-2 tablets by mouth every 6 (six) hours as needed for pain.   30 tablet   0   . metFORMIN (GLUCOPHAGE) 500 MG tablet   Oral   Take 1 tablet (500 mg total) by mouth 2 (two) times daily with a meal.   60 tablet   0   . metoprolol (LOPRESSOR) 50 MG tablet   Oral   Take 1 tablet (50 mg total) by mouth 2 (two) times daily.   60 tablet   0     BP 164/99  Pulse 58  Temp(Src) 99 F (37.2 C) (Oral)  Resp 16  SpO2 99%  LMP 02/11/2011  Physical Exam General: Well-developed, well-nourished female in no acute distress; appearance consistent with age of record HENT: normocephalic, atraumatic Eyes: pupils equal round and reactive to light; extraocular muscles intact; discs margins sharp, no retinal hemorrhages seen Neck: supple Heart: regular rate and rhythm Lungs: clear to auscultation bilaterally Abdomen:  soft; nondistended; nontender; bowel sounds present Extremities: No deformity; right knee tenderness, no bleeding posteriorly, without swelling, effusion or discoloration Neurologic: Awake, alert and oriented; motor function intact in all extremities and symmetric; no facial droop Skin: Warm and dry Psychiatric: depressed mood with congruent affect; tearful    ED Course  Procedures (including critical care time)     MDM   Nursing notes and vitals signs, including pulse oximetry, reviewed.  Summary of this visit's results, reviewed by myself:  Labs:  Results for orders placed during the hospital encounter of 03/21/13 (from the past 24 hour(s))  GLUCOSE, CAPILLARY     Status: Abnormal   Collection Time    03/21/13  4:16 AM      Result Value Range   Glucose-Capillary 258 (*) 70 - 99 mg/dL   Comment 1 Notify RN     Comment 2  Documented in Chart    CBC WITH DIFFERENTIAL     Status: Abnormal   Collection Time    03/21/13  4:54 AM      Result Value Range   WBC 6.4  4.0 - 10.5 K/uL   RBC 5.02  3.87 - 5.11 MIL/uL   Hemoglobin 12.8  12.0 - 15.0 g/dL   HCT 62.1  30.8 - 65.7 %   MCV 75.1 (*) 78.0 - 100.0 fL   MCH 25.5 (*) 26.0 - 34.0 pg   MCHC 34.0  30.0 - 36.0 g/dL   RDW 84.6  96.2 - 95.2 %   Platelets 265  150 - 400 K/uL   Neutrophils Relative 64  43 - 77 %   Neutro Abs 4.1  1.7 - 7.7 K/uL   Lymphocytes Relative 28  12 - 46 %   Lymphs Abs 1.8  0.7 - 4.0 K/uL   Monocytes Relative 8  3 - 12 %   Monocytes Absolute 0.5  0.1 - 1.0 K/uL   Eosinophils Relative 1  0 - 5 %   Eosinophils Absolute 0.1  0.0 - 0.7 K/uL   Basophils Relative 0  0 - 1 %   Basophils Absolute 0.0  0.0 - 0.1 K/uL  POCT I-STAT, CHEM 8     Status: Abnormal   Collection Time    03/21/13  5:04 AM      Result Value Range   Sodium 140  135 - 145 mEq/L   Potassium 3.8  3.5 - 5.1 mEq/L   Chloride 101  96 - 112 mEq/L   BUN 7  6 - 23 mg/dL   Creatinine, Ser 8.41  0.50 - 1.10 mg/dL   Glucose, Bld 324 (*) 70 - 99 mg/dL   Calcium, Ion 4.01 (*) 1.12 - 1.23 mmol/L   TCO2 29  0 - 100 mmol/L   Hemoglobin 13.9  12.0 - 15.0 g/dL   HCT 02.7  25.3 - 66.4 %   6:31 AM Patient's sugar now 134 after IV fluid bolus and insulin subcutaneous.  The patient was advised of her blood sugar and diagnosis of diabetes. She does not currently have a primary care physician and we will help her establish with one. We will refill her metoprolol of which she took the last yesterday and start her on metformin. She was advised of the seriousness of untreated hypertension and diabetes. We will also refer her to orthopedics for her chronic knee pain.         Hanley Seamen, MD 03/21/13 4034  Hanley Seamen, MD 03/21/13 [phone]

## 2014-01-08 IMAGING — CR DG OS CALCIS 2+V*L*
3 series · 3 of 3 positions shown · non-contrast
Comparison: None.

CLINICAL DATA: Left heel and plantar region pain.

LEFT OS CALCIS - 2+ VIEW

[x calcaneus axial left]
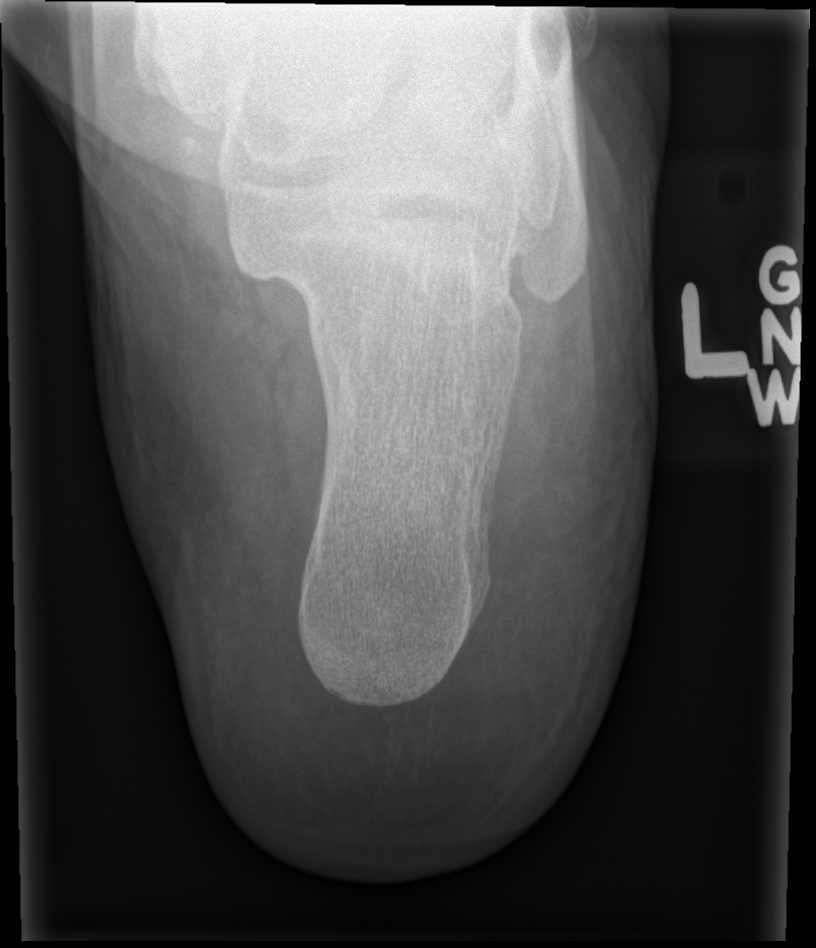

[x calcaneus lat left (1 of 2)]
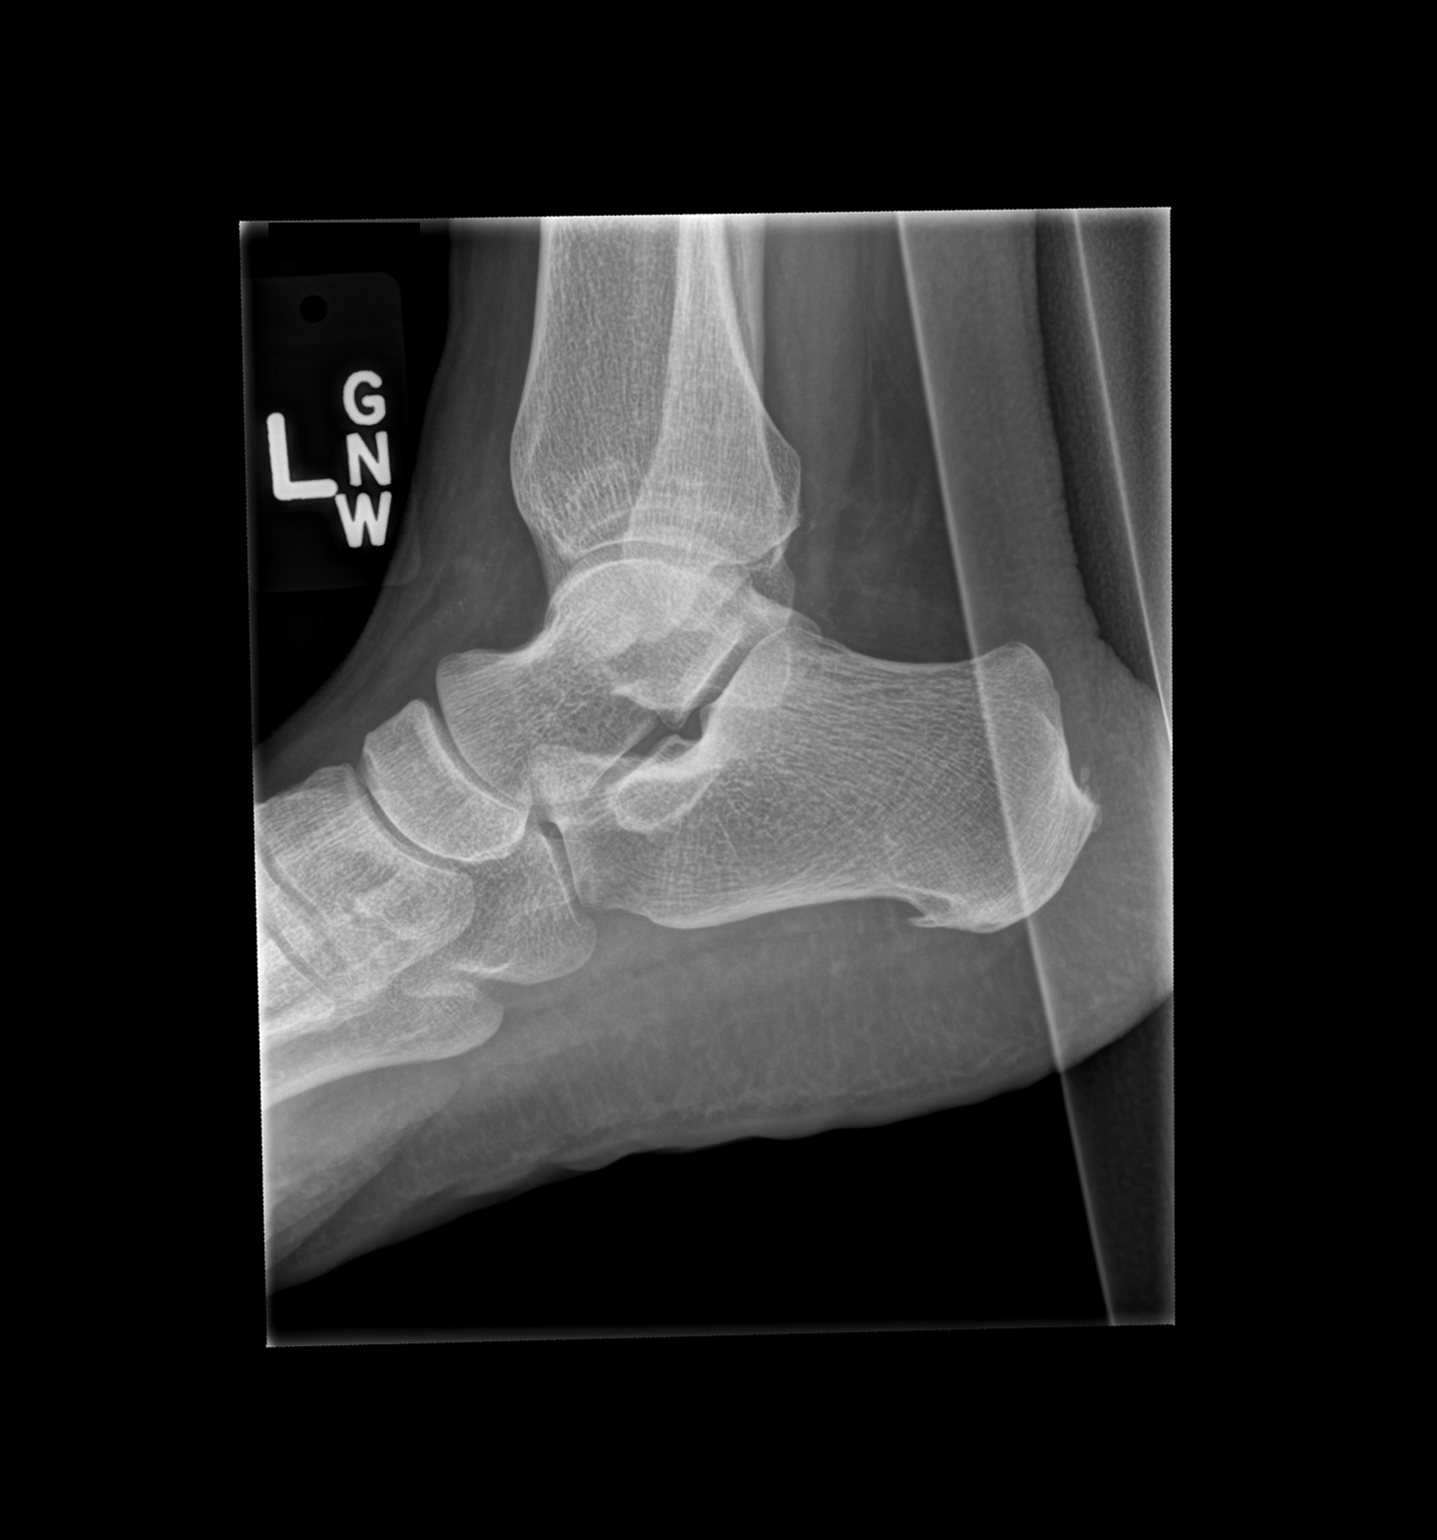

[x calcaneus lat left (2 of 2)]
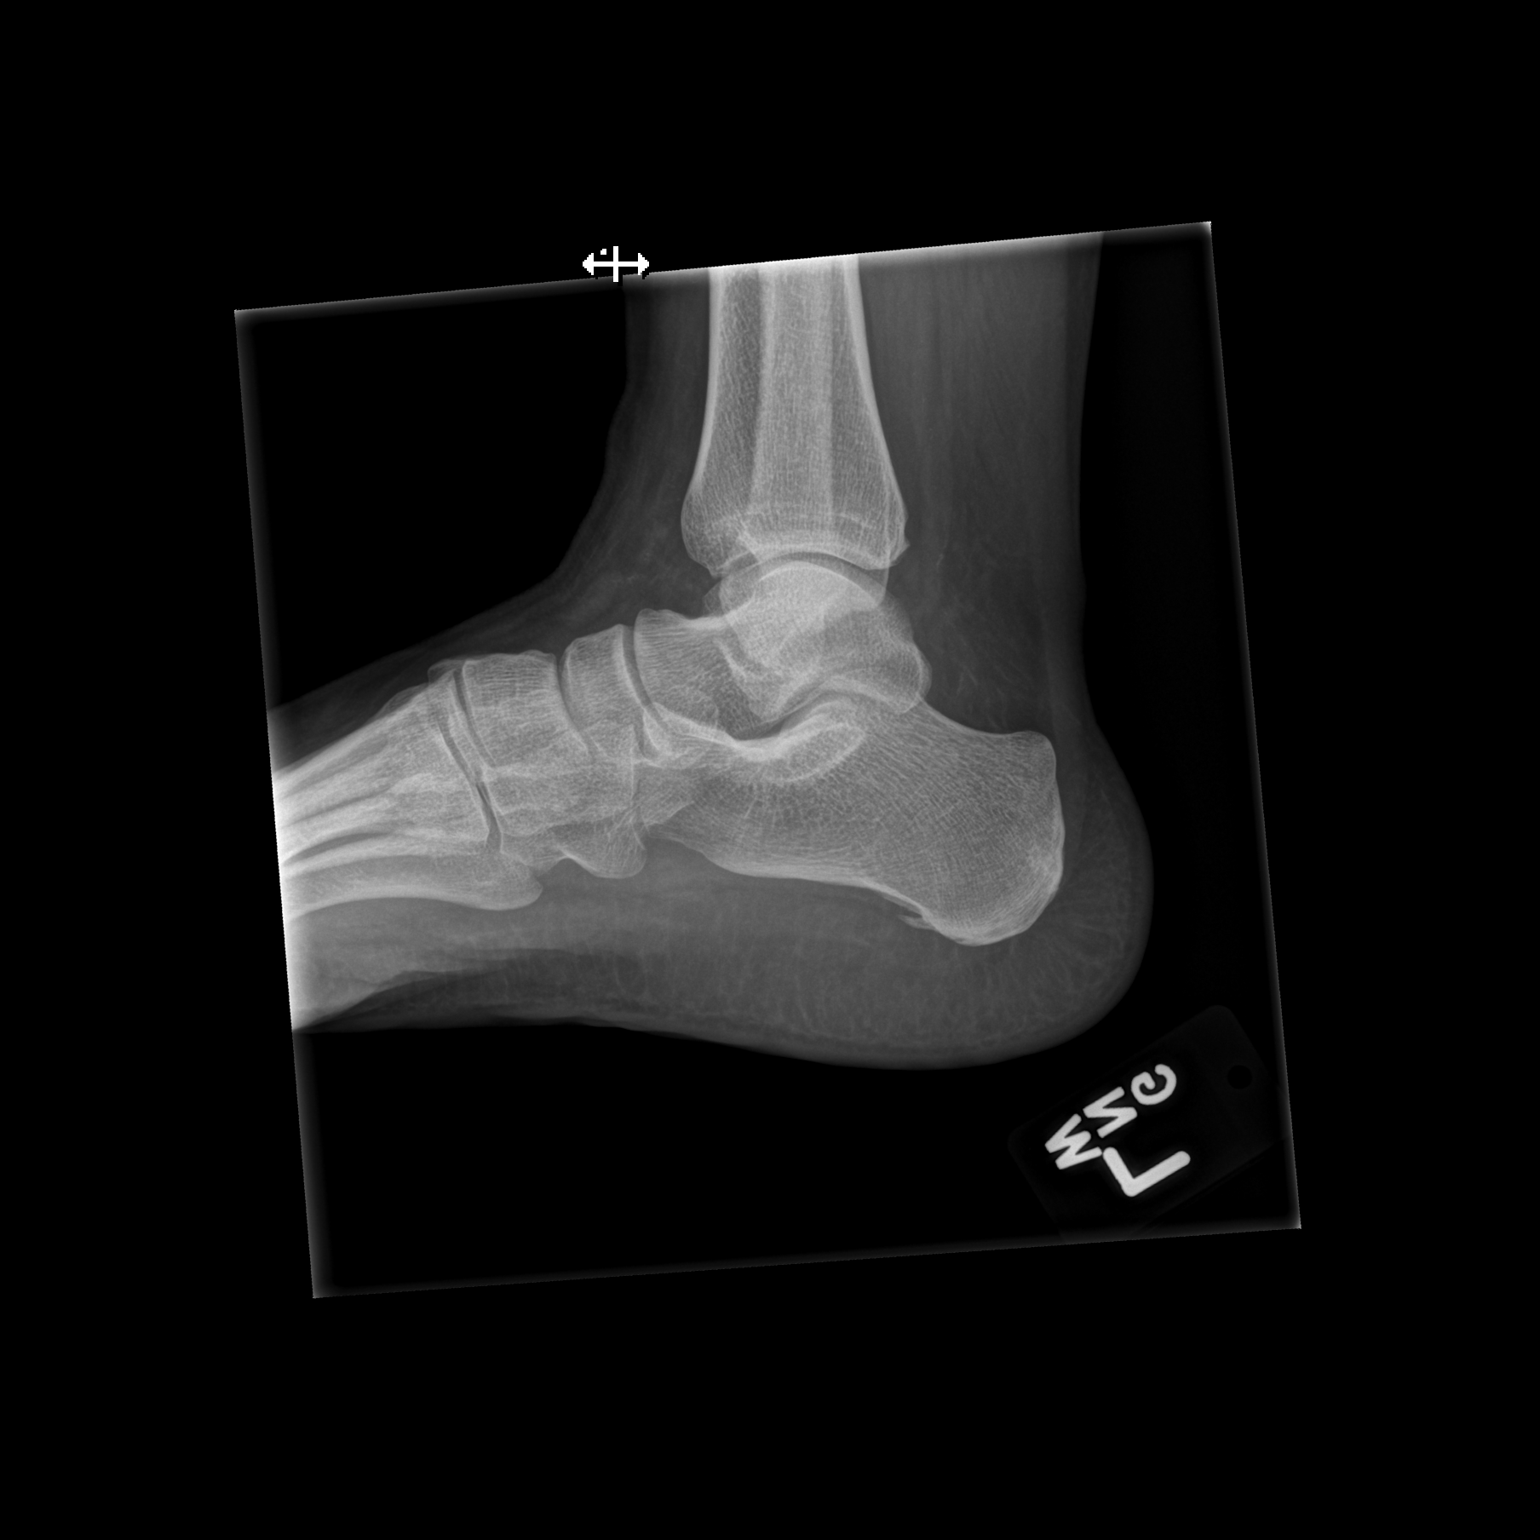

[3 of 3 positions shown; findings below may reference images not displayed]

FINDINGS: No fracture or bone lesion.  There are small plantar and
dorsal calcaneal spurs.  The soft tissues are unremarkable.
IMPRESSION: Calcaneal spurs.  No other abnormality.

## 2021-06-10 DEATH — deceased

## 2024-10-01 ENCOUNTER — Other Ambulatory Visit: Payer: Self-pay
